# Patient Record
Sex: Female | Born: 1950 | Race: White | Hispanic: No | Marital: Single | Smoking: Never smoker
Health system: Southern US, Community
[De-identification: ages and names within clinical notes are randomized; demographics above are authoritative.]

## PROBLEM LIST (undated history)

## (undated) ENCOUNTER — Emergency Department (HOSPITAL_COMMUNITY): Payer: Medicare Other

## (undated) DIAGNOSIS — F209 Schizophrenia, unspecified: Secondary | ICD-10-CM

## (undated) DIAGNOSIS — F329 Major depressive disorder, single episode, unspecified: Secondary | ICD-10-CM

## (undated) DIAGNOSIS — F32A Depression, unspecified: Secondary | ICD-10-CM

## (undated) HISTORY — DX: Depression, unspecified: F32.A

## (undated) HISTORY — DX: Schizophrenia, unspecified: F20.9

## (undated) HISTORY — DX: Major depressive disorder, single episode, unspecified: F32.9

## (undated) HISTORY — PX: SPINE SURGERY: SHX786

---

## 1999-08-21 ENCOUNTER — Encounter: Admission: RE | Admit: 1999-08-21 | Discharge: 1999-08-21 | Payer: Self-pay | Admitting: General Practice

## 1999-08-21 ENCOUNTER — Encounter: Payer: Self-pay | Admitting: General Practice

## 1999-08-26 ENCOUNTER — Emergency Department (HOSPITAL_COMMUNITY): Admission: EM | Admit: 1999-08-26 | Discharge: 1999-08-26 | Payer: Self-pay | Admitting: Emergency Medicine

## 1999-08-26 ENCOUNTER — Encounter: Payer: Self-pay | Admitting: Emergency Medicine

## 2000-03-09 ENCOUNTER — Other Ambulatory Visit: Admission: RE | Admit: 2000-03-09 | Discharge: 2000-03-09 | Payer: Self-pay | Admitting: *Deleted

## 2000-03-17 ENCOUNTER — Ambulatory Visit (HOSPITAL_COMMUNITY): Admission: RE | Admit: 2000-03-17 | Discharge: 2000-03-17 | Payer: Self-pay

## 2001-04-07 ENCOUNTER — Ambulatory Visit (HOSPITAL_COMMUNITY): Admission: RE | Admit: 2001-04-07 | Discharge: 2001-04-07 | Payer: Self-pay | Admitting: Family Medicine

## 2001-04-12 ENCOUNTER — Encounter: Payer: Self-pay | Admitting: Family Medicine

## 2001-04-12 ENCOUNTER — Encounter: Admission: RE | Admit: 2001-04-12 | Discharge: 2001-04-12 | Payer: Self-pay | Admitting: Family Medicine

## 2001-05-12 ENCOUNTER — Encounter: Admission: RE | Admit: 2001-05-12 | Discharge: 2001-05-12 | Payer: Self-pay | Admitting: Family Medicine

## 2001-05-12 ENCOUNTER — Encounter: Payer: Self-pay | Admitting: Family Medicine

## 2001-11-10 ENCOUNTER — Encounter: Admission: RE | Admit: 2001-11-10 | Discharge: 2001-11-10 | Payer: Self-pay | Admitting: Family Medicine

## 2001-11-10 ENCOUNTER — Encounter: Payer: Self-pay | Admitting: Family Medicine

## 2002-06-14 ENCOUNTER — Encounter: Payer: Self-pay | Admitting: Family Medicine

## 2002-06-14 ENCOUNTER — Encounter: Admission: RE | Admit: 2002-06-14 | Discharge: 2002-06-14 | Payer: Self-pay | Admitting: Family Medicine

## 2003-06-15 ENCOUNTER — Encounter: Admission: RE | Admit: 2003-06-15 | Discharge: 2003-06-15 | Payer: Self-pay | Admitting: Family Medicine

## 2003-08-10 ENCOUNTER — Other Ambulatory Visit: Admission: RE | Admit: 2003-08-10 | Discharge: 2003-08-10 | Payer: Self-pay | Admitting: Family Medicine

## 2003-11-24 ENCOUNTER — Ambulatory Visit: Payer: Self-pay | Admitting: Nurse Practitioner

## 2003-12-19 ENCOUNTER — Ambulatory Visit: Payer: Self-pay | Admitting: Nurse Practitioner

## 2004-02-12 ENCOUNTER — Ambulatory Visit: Payer: Self-pay | Admitting: *Deleted

## 2004-03-20 ENCOUNTER — Ambulatory Visit: Payer: Self-pay | Admitting: Nurse Practitioner

## 2004-03-22 ENCOUNTER — Ambulatory Visit (HOSPITAL_COMMUNITY): Admission: RE | Admit: 2004-03-22 | Discharge: 2004-03-22 | Payer: Self-pay | Admitting: Internal Medicine

## 2004-04-03 ENCOUNTER — Ambulatory Visit: Payer: Self-pay | Admitting: Nurse Practitioner

## 2004-04-15 ENCOUNTER — Ambulatory Visit: Payer: Self-pay | Admitting: Nurse Practitioner

## 2004-04-23 ENCOUNTER — Ambulatory Visit (HOSPITAL_COMMUNITY): Admission: RE | Admit: 2004-04-23 | Discharge: 2004-04-23 | Payer: Self-pay | Admitting: Nurse Practitioner

## 2004-05-16 ENCOUNTER — Ambulatory Visit: Payer: Self-pay | Admitting: Nurse Practitioner

## 2004-07-24 ENCOUNTER — Ambulatory Visit: Payer: Self-pay | Admitting: Nurse Practitioner

## 2004-07-30 ENCOUNTER — Ambulatory Visit: Payer: Self-pay | Admitting: Family Medicine

## 2004-08-15 ENCOUNTER — Ambulatory Visit (HOSPITAL_COMMUNITY): Admission: RE | Admit: 2004-08-15 | Discharge: 2004-08-15 | Payer: Self-pay | Admitting: Gastroenterology

## 2004-08-15 ENCOUNTER — Encounter (INDEPENDENT_AMBULATORY_CARE_PROVIDER_SITE_OTHER): Payer: Self-pay | Admitting: Specialist

## 2004-08-22 ENCOUNTER — Ambulatory Visit: Payer: Self-pay | Admitting: Nurse Practitioner

## 2004-09-25 ENCOUNTER — Ambulatory Visit: Payer: Self-pay | Admitting: Nurse Practitioner

## 2004-11-14 ENCOUNTER — Ambulatory Visit: Payer: Self-pay | Admitting: Nurse Practitioner

## 2004-12-24 ENCOUNTER — Ambulatory Visit: Payer: Self-pay | Admitting: Nurse Practitioner

## 2005-01-24 ENCOUNTER — Ambulatory Visit: Payer: Self-pay | Admitting: Nurse Practitioner

## 2005-02-24 ENCOUNTER — Ambulatory Visit: Payer: Self-pay | Admitting: Nurse Practitioner

## 2005-03-25 ENCOUNTER — Ambulatory Visit: Payer: Self-pay | Admitting: Nurse Practitioner

## 2005-04-28 ENCOUNTER — Ambulatory Visit: Payer: Self-pay | Admitting: Nurse Practitioner

## 2005-05-30 ENCOUNTER — Ambulatory Visit: Payer: Self-pay | Admitting: Nurse Practitioner

## 2005-07-08 ENCOUNTER — Ambulatory Visit: Payer: Self-pay | Admitting: Nurse Practitioner

## 2005-07-10 ENCOUNTER — Ambulatory Visit: Payer: Self-pay | Admitting: Nurse Practitioner

## 2005-08-06 ENCOUNTER — Ambulatory Visit: Payer: Self-pay | Admitting: Nurse Practitioner

## 2006-02-12 ENCOUNTER — Ambulatory Visit: Payer: Self-pay | Admitting: Nurse Practitioner

## 2006-03-05 ENCOUNTER — Ambulatory Visit: Payer: Self-pay | Admitting: Nurse Practitioner

## 2006-03-05 ENCOUNTER — Other Ambulatory Visit: Admission: RE | Admit: 2006-03-05 | Discharge: 2006-03-05 | Payer: Self-pay | Admitting: Family Medicine

## 2006-03-05 ENCOUNTER — Encounter (INDEPENDENT_AMBULATORY_CARE_PROVIDER_SITE_OTHER): Payer: Self-pay | Admitting: Nurse Practitioner

## 2006-03-10 ENCOUNTER — Encounter: Admission: RE | Admit: 2006-03-10 | Discharge: 2006-03-10 | Payer: Self-pay | Admitting: Family Medicine

## 2006-04-13 ENCOUNTER — Ambulatory Visit: Payer: Self-pay | Admitting: Nurse Practitioner

## 2006-06-15 ENCOUNTER — Ambulatory Visit: Payer: Self-pay | Admitting: Internal Medicine

## 2006-06-19 ENCOUNTER — Encounter (INDEPENDENT_AMBULATORY_CARE_PROVIDER_SITE_OTHER): Payer: Self-pay | Admitting: Nurse Practitioner

## 2006-06-19 DIAGNOSIS — F200 Paranoid schizophrenia: Secondary | ICD-10-CM | POA: Insufficient documentation

## 2006-06-19 DIAGNOSIS — R634 Abnormal weight loss: Secondary | ICD-10-CM

## 2006-12-15 ENCOUNTER — Ambulatory Visit: Payer: Self-pay | Admitting: Family Medicine

## 2007-01-13 ENCOUNTER — Ambulatory Visit: Payer: Self-pay | Admitting: Internal Medicine

## 2007-01-13 ENCOUNTER — Encounter (INDEPENDENT_AMBULATORY_CARE_PROVIDER_SITE_OTHER): Payer: Self-pay | Admitting: Nurse Practitioner

## 2007-01-13 LAB — CONVERTED CEMR LAB
HDL: 58 mg/dL (ref >39)
LDL Cholesterol: 151 mg/dL — ABNORMAL HIGH (ref 0–99)
Triglycerides: 86 mg/dL (ref <150)
VLDL: 17 mg/dL (ref 0–40)

## 2007-03-09 ENCOUNTER — Ambulatory Visit: Payer: Self-pay | Admitting: Family Medicine

## 2007-03-09 ENCOUNTER — Encounter (INDEPENDENT_AMBULATORY_CARE_PROVIDER_SITE_OTHER): Payer: Self-pay | Admitting: Nurse Practitioner

## 2007-03-09 LAB — CONVERTED CEMR LAB
ALT: 10 units/L (ref 0–35)
Albumin: 4.3 g/dL (ref 3.5–5.2)
Basophils Absolute: 0 10*3/uL (ref 0.0–0.1)
CO2: 24 meq/L (ref 19–32)
Calcium: 9.8 mg/dL (ref 8.4–10.5)
Chloride: 104 meq/L (ref 96–112)
Eosinophils Relative: 5 % (ref 0–5)
Glucose, Bld: 103 mg/dL — ABNORMAL HIGH (ref 70–99)
HCT: 41.8 % (ref 36.0–46.0)
Hemoglobin: 13.4 g/dL (ref 12.0–15.0)
Lymphocytes Relative: 24 % (ref 12–46)
Lymphs Abs: 0.9 10*3/uL (ref 0.7–4.0)
Neutro Abs: 2.4 10*3/uL (ref 1.7–7.7)
Platelets: 278 10*3/uL (ref 150–400)
RDW: 12.8 % (ref 11.5–15.5)
Sodium: 142 meq/L (ref 135–145)
TSH: 1.485 microintl units/mL (ref 0.350–5.50)
Total Protein: 7.3 g/dL (ref 6.0–8.3)
WBC: 4 10*3/uL (ref 4.0–10.5)

## 2007-03-16 ENCOUNTER — Ambulatory Visit (HOSPITAL_COMMUNITY): Admission: RE | Admit: 2007-03-16 | Discharge: 2007-03-16 | Payer: Self-pay | Admitting: Family Medicine

## 2007-03-23 ENCOUNTER — Ambulatory Visit: Payer: Self-pay | Admitting: Family Medicine

## 2007-03-23 ENCOUNTER — Encounter (INDEPENDENT_AMBULATORY_CARE_PROVIDER_SITE_OTHER): Payer: Self-pay | Admitting: Nurse Practitioner

## 2007-03-23 LAB — CONVERTED CEMR LAB: LDL Cholesterol: 134 mg/dL — ABNORMAL HIGH (ref 0–99)

## 2008-03-22 ENCOUNTER — Encounter (INDEPENDENT_AMBULATORY_CARE_PROVIDER_SITE_OTHER): Payer: Self-pay | Admitting: Internal Medicine

## 2008-03-22 ENCOUNTER — Ambulatory Visit: Payer: Self-pay | Admitting: Family Medicine

## 2008-03-22 LAB — CONVERTED CEMR LAB
ALT: 16 units/L (ref 0–35)
AST: 26 units/L (ref 0–37)
Albumin: 4.4 g/dL (ref 3.5–5.2)
BUN: 15 mg/dL (ref 6–23)
CO2: 23 meq/L (ref 19–32)
Calcium: 9.4 mg/dL (ref 8.4–10.5)
Chloride: 107 meq/L (ref 96–112)
Eosinophils Relative: 2 % (ref 0–5)
HCT: 40.8 % (ref 36.0–46.0)
Hemoglobin: 13.4 g/dL (ref 12.0–15.0)
Lymphocytes Relative: 24 % (ref 12–46)
Lymphs Abs: 1.5 10*3/uL (ref 0.7–4.0)
MCV: 89.7 fL (ref 78.0–100.0)
Monocytes Relative: 7 % (ref 3–12)
Platelets: 256 10*3/uL (ref 150–400)
Potassium: 4.1 meq/L (ref 3.5–5.3)
RBC: 4.55 M/uL (ref 3.87–5.11)
WBC: 6.1 10*3/uL (ref 4.0–10.5)

## 2008-03-29 ENCOUNTER — Ambulatory Visit (HOSPITAL_COMMUNITY): Admission: RE | Admit: 2008-03-29 | Discharge: 2008-03-29 | Payer: Self-pay | Admitting: Internal Medicine

## 2008-04-17 ENCOUNTER — Encounter (INDEPENDENT_AMBULATORY_CARE_PROVIDER_SITE_OTHER): Payer: Self-pay | Admitting: Internal Medicine

## 2008-04-17 ENCOUNTER — Ambulatory Visit: Payer: Self-pay | Admitting: Internal Medicine

## 2008-04-17 LAB — CONVERTED CEMR LAB
Cholesterol: 235 mg/dL — ABNORMAL HIGH (ref 0–200)
HDL: 51 mg/dL (ref >39)

## 2008-07-21 ENCOUNTER — Ambulatory Visit: Payer: Self-pay | Admitting: Family Medicine

## 2008-07-28 ENCOUNTER — Ambulatory Visit (HOSPITAL_COMMUNITY): Admission: RE | Admit: 2008-07-28 | Discharge: 2008-07-28 | Payer: Self-pay | Admitting: Internal Medicine

## 2008-09-21 ENCOUNTER — Encounter (INDEPENDENT_AMBULATORY_CARE_PROVIDER_SITE_OTHER): Payer: Self-pay | Admitting: Internal Medicine

## 2008-09-21 ENCOUNTER — Ambulatory Visit: Payer: Self-pay | Admitting: Internal Medicine

## 2008-09-21 LAB — CONVERTED CEMR LAB: Vit D, 25-Hydroxy: 28 ng/mL — ABNORMAL LOW (ref 30–89)

## 2009-02-15 ENCOUNTER — Other Ambulatory Visit: Admission: RE | Admit: 2009-02-15 | Discharge: 2009-02-15 | Payer: Self-pay | Admitting: Internal Medicine

## 2009-02-15 ENCOUNTER — Ambulatory Visit: Payer: Self-pay | Admitting: Internal Medicine

## 2009-02-15 LAB — CONVERTED CEMR LAB: Pap Smear: NEGATIVE

## 2009-05-15 ENCOUNTER — Ambulatory Visit: Payer: Self-pay | Admitting: Internal Medicine

## 2009-08-15 ENCOUNTER — Ambulatory Visit: Payer: Self-pay | Admitting: Internal Medicine

## 2009-08-23 ENCOUNTER — Ambulatory Visit (HOSPITAL_COMMUNITY): Admission: RE | Admit: 2009-08-23 | Discharge: 2009-08-23 | Payer: Self-pay | Admitting: Internal Medicine

## 2009-09-27 ENCOUNTER — Encounter: Admission: RE | Admit: 2009-09-27 | Discharge: 2009-10-17 | Payer: Self-pay | Admitting: Internal Medicine

## 2009-11-27 ENCOUNTER — Encounter
Admission: RE | Admit: 2009-11-27 | Discharge: 2010-01-03 | Payer: Self-pay | Source: Home / Self Care | Attending: Family Medicine | Admitting: Family Medicine

## 2010-01-16 ENCOUNTER — Encounter
Admission: RE | Admit: 2010-01-16 | Discharge: 2010-01-16 | Payer: Self-pay | Source: Home / Self Care | Attending: Family Medicine | Admitting: Family Medicine

## 2010-01-16 ENCOUNTER — Encounter: Admit: 2010-01-16 | Payer: Self-pay | Admitting: Family Medicine

## 2010-02-13 ENCOUNTER — Other Ambulatory Visit: Payer: Self-pay | Admitting: Family Medicine

## 2010-02-13 ENCOUNTER — Other Ambulatory Visit (HOSPITAL_COMMUNITY)
Admission: RE | Admit: 2010-02-13 | Discharge: 2010-02-13 | Disposition: A | Discharge status: Home / Self Care | Payer: Medicaid Other | Source: Ambulatory Visit | Attending: Family Medicine | Admitting: Family Medicine

## 2010-02-13 ENCOUNTER — Other Ambulatory Visit (HOSPITAL_COMMUNITY): Payer: Self-pay | Admitting: Family Medicine

## 2010-02-13 DIAGNOSIS — Z01419 Encounter for gynecological examination (general) (routine) without abnormal findings: Secondary | ICD-10-CM | POA: Insufficient documentation

## 2010-02-13 DIAGNOSIS — Z1231 Encounter for screening mammogram for malignant neoplasm of breast: Secondary | ICD-10-CM

## 2010-02-21 ENCOUNTER — Encounter (HOSPITAL_COMMUNITY): Payer: Self-pay

## 2010-02-21 ENCOUNTER — Ambulatory Visit (HOSPITAL_COMMUNITY)
Admission: RE | Admit: 2010-02-21 | Discharge: 2010-02-21 | Disposition: A | Discharge status: Home / Self Care | Payer: Medicaid Other | Source: Ambulatory Visit | Attending: Family Medicine | Admitting: Family Medicine

## 2010-02-21 DIAGNOSIS — Z1231 Encounter for screening mammogram for malignant neoplasm of breast: Secondary | ICD-10-CM | POA: Insufficient documentation

## 2010-05-24 NOTE — Op Note (Signed)
[name], [name]              ACCOUNT NO.:  [id number]   MEDICAL RECORD NO.:  [id number]          PATIENT TYPE:  AMB   LOCATION:  ENDO                         FACILITY:  [facility]   PHYSICIAN:  James L. Malon Kindle., M.D.DATE OF BIRTH:  Jan 08, 1950   DATE OF PROCEDURE:  08/15/2004  DATE OF DISCHARGE:                                 OPERATIVE REPORT   PROCEDURE:  Esophagogastroduodenoscopy with biopsy.   MEDICATIONS:  Fentanyl 50 mcg, Versed 5 mg IV.   INDICATIONS FOR PROCEDURE:  Severe weight loss of unclear cause, vague  dyspepsia.   DESCRIPTION OF PROCEDURE:  The procedure had been explained to the patient  and consent obtained. With the patient in the left lateral decubitus  position, the Olympus scope was inserted and advanced __________ pylorus  identified and passed. The duodenum including the bulb and second portion  were seen well. I advanced the scope down to the full length which was felt  to be in the distal third duodenum and possibly even the fourth duodenum.  Multiple biopsies were obtained to look for celiac disease. The scope was  withdrawn. No other abnormalities were seen in the duodenum. The pyloric  channel and external body were normal. Fundus and cardia seen well on the  retroflexed view and were normal. The patient had a hiatal hernia with a  widely patent GE junction with a red distal esophagus consistent with  esophageal reflux, no ulceration or inflammation. The proximal esophagus was  seen well and was endoscopically normal. The scope was withdrawn, the  patient tolerated the procedure well.   ASSESSMENT:  1.  Gastroesophageal reflux disease possibly causing some of her upper      symptoms, 530.81.  2.  Weight loss, need to rule out celiac disease.   PLAN:  Will check path results, start empirically on Prilosec generically  and do colonoscopy at this time.       JLE/MEDQ  D:  08/15/2004  T:  08/15/2004  Job:  [id number]   cc:   Tresa Endo L. Philipp Deputy,  M.D.  [phone] [address]  [place] [address]  Fax: [phone]

## 2010-05-24 NOTE — Op Note (Signed)
Emily, Vega              ACCOUNT NO.:  [id number]   MEDICAL RECORD NO.:  [id number]          PATIENT TYPE:  AMB   LOCATION:  ENDO                         FACILITY:  [facility]   PHYSICIAN:  James L. Malon Kindle., M.D.DATE OF BIRTH:  [birth year]   DATE OF PROCEDURE:  08/15/2004  DATE OF DISCHARGE:                                 OPERATIVE REPORT   PROCEDURE:  Colonoscopy.   MEDICATIONS:  The patient received fentanyl 50 mcg and Versed 5 mg for both  procedures.   SCOPE:  Pediatric adjustable scope.   INDICATIONS FOR PROCEDURE:  Weight loss with no previous colon screening.   DESCRIPTION OF PROCEDURE:  Following the endoscopy, the patient was turned  around, the pediatric adjustable colonoscope was inserted and advanced. The  prep was excellent. She had an extraordinarily long tortuous colon,  maneuvers including abdominal pressure were required. We were able to reach  the cecum. The ileocecal valve and appendiceal orifice identified, scope  withdrawn. Mucosa examined on withdrawal, normal mucosal pattern throughout  without signs of colitis. No polyps, masses or diverticulosis seen. Again a  very long tortuous colon. Rectum free of lesions. Colon decompressed as much  as possible and scope withdrawn. The patient tolerated the procedure well.   ASSESSMENT:  Normal screening colonoscopy, V76.51.   PLAN:  Will followup in the office in six months and encourage her to eat  whatever she wants.       JLE/MEDQ  D:  08/15/2004  T:  08/15/2004  Job:  [id number]   cc:   Tresa Endo L. Philipp Deputy, M.D.  [phone] [address]  [place] [address]  Fax: [phone]

## 2011-03-14 ENCOUNTER — Other Ambulatory Visit (HOSPITAL_COMMUNITY): Payer: Self-pay | Admitting: Family Medicine

## 2011-03-14 DIAGNOSIS — Z1231 Encounter for screening mammogram for malignant neoplasm of breast: Secondary | ICD-10-CM

## 2011-03-28 ENCOUNTER — Ambulatory Visit (HOSPITAL_COMMUNITY)
Admission: RE | Admit: 2011-03-28 | Discharge: 2011-03-28 | Disposition: A | Discharge status: Home / Self Care | Payer: Medicaid Other | Source: Ambulatory Visit | Attending: Family Medicine | Admitting: Family Medicine

## 2011-03-28 DIAGNOSIS — Z1231 Encounter for screening mammogram for malignant neoplasm of breast: Secondary | ICD-10-CM

## 2011-06-20 ENCOUNTER — Emergency Department (HOSPITAL_COMMUNITY)
Admission: EM | Admit: 2011-06-20 | Discharge: 2011-06-20 | Disposition: A | Discharge status: Home / Self Care | Payer: Medicaid Other | Source: Home / Self Care | Attending: Family Medicine | Admitting: Family Medicine

## 2011-06-20 ENCOUNTER — Encounter (HOSPITAL_COMMUNITY): Payer: Self-pay | Admitting: Emergency Medicine

## 2011-06-20 DIAGNOSIS — L2081 Atopic neurodermatitis: Secondary | ICD-10-CM

## 2011-06-20 DIAGNOSIS — L2089 Other atopic dermatitis: Secondary | ICD-10-CM

## 2011-06-20 MED ORDER — FLUTICASONE PROPIONATE 0.05 % EX CREA: TOPICAL_CREAM | Freq: Two times a day (BID) | CUTANEOUS | Status: AC

## 2011-06-20 NOTE — ED Notes (Signed)
Pt here with sunburn to right forearm and left hand x 2 weeks after being exposed to sun without sunscreen.scabbed red areas seen but no inflammation

## 2011-06-20 NOTE — ED Provider Notes (Signed)
History     CSN: [id number]  Arrival date & time 06/20/11  1101   First MD Initiated Contact with Patient 06/20/11 1112      Chief Complaint  Patient presents with  . Sunburn    (Consider location/radiation/quality/duration/timing/severity/associated sxs/prior treatment) Patient is a 61 y.o. female presenting with rash.  Rash  This is a new problem. The current episode started more than 1 week ago. The problem has not changed since onset.Associated with: feels related to sun exposure, typical rxn. There has been no fever. The rash is present on the right hand and left hand. The patient is experiencing no pain. Associated symptoms include blisters and itching.    History reviewed. No pertinent past medical history.  Past Surgical History  Procedure Date  . Spine surgery     No family history on file.  History  Substance Use Topics  . Smoking status: Never Smoker   . Smokeless tobacco: Not on file  . Alcohol Use: No    OB History    Grav Para Term Preterm Abortions TAB SAB Ect Mult Living                  Review of Systems  Constitutional: Negative.   Skin: Positive for itching and rash.    Allergies  Review of patient's allergies indicates no known allergies.  Home Medications   Current Outpatient Rx  Name Route Sig Dispense Refill  . VITAMIN D 1000 UNITS PO TABS Oral Take 1,000 Units by mouth daily.    Marland Kitchen FLUOXETINE HCL 20 MG PO CAPS Oral Take 20 mg by mouth daily.    Marland Kitchen OLANZAPINE 10 MG PO TABS Oral Take 10 mg by mouth at bedtime.    Marland Kitchen PENICILLIN V POTASSIUM 250 MG PO TABS Oral Take 250 mg by mouth 4 (four) times daily. Pt been on med for 10dys for nose surgery    . FLUTICASONE PROPIONATE 0.05 % EX CREA Topical Apply topically 2 (two) times daily. 30 g 0    BP 110/73  Pulse 88  Temp 97.9 F (36.6 C) (Oral)  Resp 16  SpO2 96%  Physical Exam  Nursing note and vitals reviewed. Constitutional: She appears well-developed and well-nourished.  Skin: Skin  is warm and dry. Rash noted.       Patchy excoriated dry rash on dorsum of hands and wrists.    ED Course  Procedures (including critical care time)  Labs Reviewed - No data to display No results found.   1. Atopic neurodermatitis       MDM          Linna Hoff, MD 06/20/11 1131

## 2012-05-18 ENCOUNTER — Other Ambulatory Visit (HOSPITAL_COMMUNITY): Payer: Self-pay | Admitting: Internal Medicine

## 2012-05-18 DIAGNOSIS — Z1231 Encounter for screening mammogram for malignant neoplasm of breast: Secondary | ICD-10-CM

## 2012-05-27 ENCOUNTER — Ambulatory Visit (HOSPITAL_COMMUNITY)
Admission: RE | Admit: 2012-05-27 | Discharge: 2012-05-27 | Disposition: A | Discharge status: Home / Self Care | Payer: Medicaid Other | Source: Ambulatory Visit | Attending: Internal Medicine | Admitting: Internal Medicine

## 2012-05-27 DIAGNOSIS — Z1231 Encounter for screening mammogram for malignant neoplasm of breast: Secondary | ICD-10-CM | POA: Insufficient documentation

## 2012-07-08 ENCOUNTER — Ambulatory Visit: Payer: Self-pay | Admitting: Obstetrics

## 2012-08-26 ENCOUNTER — Ambulatory Visit (INDEPENDENT_AMBULATORY_CARE_PROVIDER_SITE_OTHER): Payer: Medicaid Other | Admitting: Obstetrics

## 2012-08-26 ENCOUNTER — Encounter: Payer: Self-pay | Admitting: Obstetrics

## 2012-08-26 VITALS — BP 107/68 | HR 88 | Temp 98.8°F | Ht 60.0 in | Wt 105.6 lb

## 2012-08-26 DIAGNOSIS — A499 Bacterial infection, unspecified: Secondary | ICD-10-CM

## 2012-08-26 DIAGNOSIS — Z113 Encounter for screening for infections with a predominantly sexual mode of transmission: Secondary | ICD-10-CM

## 2012-08-26 DIAGNOSIS — N760 Acute vaginitis: Secondary | ICD-10-CM

## 2012-08-26 DIAGNOSIS — B9689 Other specified bacterial agents as the cause of diseases classified elsewhere: Secondary | ICD-10-CM

## 2012-08-26 DIAGNOSIS — Z0000 Encounter for general adult medical examination without abnormal findings: Secondary | ICD-10-CM

## 2012-08-26 NOTE — Progress Notes (Signed)
.   Subjective:     [name] is a 62 y.o. female here for a routine exam.  No current complaints.  Personal health questionnaire reviewed: yes.   Gynecologic History No LMP recorded. Patient is postmenopausal. Contraception: none Last Pap: 2008. Results were: normal Last mammogram: 2014. Results were: normal  Obstetric History OB History  No data available     The following portions of the patient's history were reviewed and updated as appropriate: allergies, current medications, past family history, past medical history, past social history, past surgical history and problem list.  Review of Systems Pertinent items are noted in HPI.    Objective:    General appearance: alert and no distress Breasts: normal appearance, no masses or tenderness Abdomen: normal findings: soft, non-tender Pelvic: cervix normal in appearance, external genitalia normal, no adnexal masses or tenderness, no cervical motion tenderness, uterus normal size, shape, and consistency and vagina normal without discharge Extremities: extremities normal, atraumatic, no cyanosis or edema    Assessment:    Healthy female exam.    Plan:    Follow up in: 1 year.

## 2012-08-27 ENCOUNTER — Encounter: Payer: Self-pay | Admitting: Obstetrics

## 2012-08-27 DIAGNOSIS — B9689 Other specified bacterial agents as the cause of diseases classified elsewhere: Secondary | ICD-10-CM | POA: Insufficient documentation

## 2012-08-27 LAB — PAP IG W/ RFLX HPV ASCU

## 2013-05-17 ENCOUNTER — Other Ambulatory Visit: Payer: Self-pay | Admitting: Internal Medicine

## 2013-05-17 DIAGNOSIS — E2839 Other primary ovarian failure: Secondary | ICD-10-CM

## 2013-08-18 ENCOUNTER — Other Ambulatory Visit (HOSPITAL_COMMUNITY): Payer: Self-pay | Admitting: Internal Medicine

## 2013-08-18 DIAGNOSIS — Z1231 Encounter for screening mammogram for malignant neoplasm of breast: Secondary | ICD-10-CM

## 2013-08-29 ENCOUNTER — Ambulatory Visit: Payer: Medicaid Other | Admitting: Obstetrics

## 2013-08-29 ENCOUNTER — Ambulatory Visit (HOSPITAL_COMMUNITY)
Admission: RE | Admit: 2013-08-29 | Discharge: 2013-08-29 | Disposition: A | Discharge status: Home / Self Care | Payer: Medicaid Other | Source: Ambulatory Visit

## 2013-08-29 ENCOUNTER — Ambulatory Visit (HOSPITAL_COMMUNITY)
Admission: RE | Admit: 2013-08-29 | Discharge: 2013-08-29 | Disposition: A | Discharge status: Home / Self Care | Payer: Medicaid Other | Source: Ambulatory Visit | Attending: Internal Medicine | Admitting: Internal Medicine

## 2013-08-29 ENCOUNTER — Ambulatory Visit (HOSPITAL_COMMUNITY): Payer: Medicaid Other

## 2013-08-29 DIAGNOSIS — E2839 Other primary ovarian failure: Secondary | ICD-10-CM

## 2013-08-29 DIAGNOSIS — Z780 Asymptomatic menopausal state: Secondary | ICD-10-CM | POA: Diagnosis not present

## 2013-08-29 DIAGNOSIS — Z13820 Encounter for screening for osteoporosis: Secondary | ICD-10-CM | POA: Insufficient documentation

## 2013-08-29 DIAGNOSIS — Z1231 Encounter for screening mammogram for malignant neoplasm of breast: Secondary | ICD-10-CM

## 2014-06-27 ENCOUNTER — Emergency Department (HOSPITAL_COMMUNITY)
Admission: EM | Admit: 2014-06-27 | Discharge: 2014-06-28 | Disposition: A | Discharge status: Home / Self Care | Payer: Medicaid Other | Attending: Emergency Medicine | Admitting: Emergency Medicine

## 2014-06-27 ENCOUNTER — Emergency Department (HOSPITAL_COMMUNITY): Payer: Medicaid Other

## 2014-06-27 ENCOUNTER — Encounter (HOSPITAL_COMMUNITY): Payer: Self-pay | Admitting: Emergency Medicine

## 2014-06-27 DIAGNOSIS — F329 Major depressive disorder, single episode, unspecified: Secondary | ICD-10-CM | POA: Diagnosis not present

## 2014-06-27 DIAGNOSIS — Z79899 Other long term (current) drug therapy: Secondary | ICD-10-CM | POA: Diagnosis not present

## 2014-06-27 DIAGNOSIS — R0989 Other specified symptoms and signs involving the circulatory and respiratory systems: Secondary | ICD-10-CM | POA: Diagnosis present

## 2014-06-27 DIAGNOSIS — F419 Anxiety disorder, unspecified: Secondary | ICD-10-CM | POA: Diagnosis not present

## 2014-06-27 DIAGNOSIS — F209 Schizophrenia, unspecified: Secondary | ICD-10-CM | POA: Diagnosis not present

## 2014-06-27 DIAGNOSIS — F458 Other somatoform disorders: Secondary | ICD-10-CM | POA: Diagnosis not present

## 2014-06-27 LAB — CBC WITH DIFFERENTIAL/PLATELET
BASOS PCT: 0 % (ref 0–1)
Basophils Absolute: 0 10*3/uL (ref 0.0–0.1)
EOS ABS: 0.1 10*3/uL (ref 0.0–0.7)
Eosinophils Relative: 2 % (ref 0–5)
HCT: 37 % (ref 36.0–46.0)
HEMOGLOBIN: 12.1 g/dL (ref 12.0–15.0)
LYMPHS ABS: 1.4 10*3/uL (ref 0.7–4.0)
LYMPHS PCT: 22 % (ref 12–46)
MCH: 29.6 pg (ref 26.0–34.0)
MCHC: 32.7 g/dL (ref 30.0–36.0)
MCV: 90.5 fL (ref 78.0–100.0)
Monocytes Absolute: 0.7 10*3/uL (ref 0.1–1.0)
Monocytes Relative: 11 % (ref 3–12)
Neutro Abs: 4.2 10*3/uL (ref 1.7–7.7)
Neutrophils Relative %: 65 % (ref 43–77)
Platelets: 248 10*3/uL (ref 150–400)
RBC: 4.09 MIL/uL (ref 3.87–5.11)
RDW: 13.3 % (ref 11.5–15.5)
WBC: 6.5 10*3/uL (ref 4.0–10.5)

## 2014-06-27 LAB — BASIC METABOLIC PANEL
ANION GAP: 9 (ref 5–15)
BUN: 14 mg/dL (ref 6–20)
CO2: 26 mmol/L (ref 22–32)
Calcium: 8.8 mg/dL — ABNORMAL LOW (ref 8.9–10.3)
Chloride: 107 mmol/L (ref 101–111)
Creatinine, Ser: 0.61 mg/dL (ref 0.44–1.00)
GFR calc non Af Amer: >60 mL/min (ref >60)
Glucose, Bld: 110 mg/dL — ABNORMAL HIGH (ref 65–99)
POTASSIUM: 3 mmol/L — AB (ref 3.5–5.1)
Sodium: 142 mmol/L (ref 135–145)

## 2014-06-27 MED ORDER — DIPHENHYDRAMINE HCL 50 MG/ML IJ SOLN
12.5000 mg | Freq: Once | INTRAMUSCULAR | Status: AC
  Administered 2014-06-27: 12.5 mg via INTRAVENOUS
  Filled 2014-06-27: qty 1

## 2014-06-27 MED ORDER — METOCLOPRAMIDE HCL 5 MG/ML IJ SOLN
10.0000 mg | Freq: Once | INTRAMUSCULAR | Status: AC
  Administered 2014-06-27: 10 mg via INTRAVENOUS
  Filled 2014-06-27: qty 2

## 2014-06-27 MED ORDER — GLUCAGON HCL RDNA (DIAGNOSTIC) 1 MG IJ SOLR
INTRAMUSCULAR | Status: AC
  Administered 2014-06-27: 0.5 mg via INTRAVENOUS
  Filled 2014-06-27: qty 1

## 2014-06-27 MED ORDER — GLUCAGON HCL RDNA (DIAGNOSTIC) 1 MG IJ SOLR
0.5000 mg | Freq: Once | INTRAMUSCULAR | Status: AC
  Administered 2014-06-27: 0.5 mg via INTRAVENOUS

## 2014-06-27 MED ORDER — GI COCKTAIL ~~LOC~~
30.0000 mL | Freq: Once | ORAL | Status: AC
  Administered 2014-06-27: 30 mL via ORAL
  Filled 2014-06-27: qty 30

## 2014-06-27 NOTE — ED Notes (Signed)
Patient given coke to drink

## 2014-06-27 NOTE — ED Notes (Signed)
Patient reports swallowing part of an apple and feeling like some of it is still stuck in her throat.  Reports this happened tonight after trying to eat the apple without her dentures in.

## 2014-06-27 NOTE — ED Notes (Signed)
Called patients ride "[place]".  Vernita states she will come to ED to pick up pt.

## 2014-06-27 NOTE — ED Notes (Signed)
After discharge patient in lobby talking to ride on phone.  Patient then back up to registration desk stating she felt dizzy.  Placed in wheelchair BP114/67; HR84; R16; SpO2 100%RA.  Patient placed in lobby in wheelchair.  States she feels better after vital signs taken.

## 2014-06-27 NOTE — Discharge Instructions (Signed)
Eat soft diet for several days.   Make sure that you wear dentures when you eat.   Follow up with your doctor.   Return to ER if you are unable to swallow, cough, vomiting.

## 2014-06-27 NOTE — ED Notes (Signed)
Bed: WA04 Expected date:  Expected time:  Means of arrival:  Comments: EMS female/choked on an apple

## 2014-06-27 NOTE — ED Provider Notes (Signed)
CSN: [id number]     Arrival date & time 06/27/14  2109 History   First MD Initiated Contact with Patient 06/27/14 2113     Chief Complaint  Patient presents with  . Foreign Body     (Consider location/radiation/quality/duration/timing/severity/associated sxs/prior Treatment) The history is provided by the patient.  TALESHIA Vega is a 64 y.o. female hx of depression, schizophrenia here with possible food impaction. Patient ate an apple around 8 PM and didn't wear her dentures and felt the apple stuck in her throat. She was able to swallow water afterwards but still has globus sensation. Denies any trouble breathing denies any chest pain. Denies any previous history of food impaction or esophageal strictures.   Past Medical History  Diagnosis Date  . Depression   . Schizophrenia    Past Surgical History  Procedure Laterality Date  . Spine surgery     Family History  Problem Relation Age of Onset  . Heart disease Mother    History  Substance Use Topics  . Smoking status: Never Smoker   . Smokeless tobacco: Not on file  . Alcohol Use: No   OB History    No data available     Review of Systems  HENT:       Globus sensation   All other systems reviewed and are negative.     Allergies  Review of patient's allergies indicates no known allergies.  Home Medications   Prior to Admission medications   Medication Sig Start Date End Date Taking? Authorizing Provider  alendronate (FOSAMAX) 70 MG tablet Take 70 mg by mouth once a week. Take with a full glass of water on an empty stomach.   Yes Historical Provider, MD  cholecalciferol (VITAMIN D) 1000 UNITS tablet Take 1,000 Units by mouth daily.   Yes Historical Provider, MD  FLUoxetine (PROZAC) 20 MG capsule Take 20 mg by mouth daily.   Yes Historical Provider, MD  OLANZapine (ZYPREXA) 10 MG tablet Take 10 mg by mouth at bedtime.   Yes Historical Provider, MD  omeprazole (PRILOSEC) 20 MG capsule Take 20 mg by mouth daily.    Yes Historical Provider, MD  pravastatin (PRAVACHOL) 10 MG tablet Take 10 mg by mouth daily.   Yes Historical Provider, MD   BP 138/67 mmHg  Pulse 72  Temp(Src) 97.9 F (36.6 C) (Oral)  Resp 16  SpO2 98% Physical Exam  Constitutional: She is oriented to person, place, and time. She appears well-developed and well-nourished.  Slightly anxious   HENT:  Head: Normocephalic.  Mouth/Throat: Oropharynx is clear and moist.  No foreign body visualized   Eyes: Conjunctivae are normal. Pupils are equal, round, and reactive to light.  Neck:  No stridor.   Cardiovascular: Normal rate, regular rhythm and normal heart sounds.   Pulmonary/Chest: Effort normal and breath sounds normal. No respiratory distress. She has no wheezes. She has no rales.  Abdominal: Soft. Bowel sounds are normal. She exhibits no distension. There is no tenderness. There is no rebound.  Musculoskeletal: Normal range of motion. She exhibits no edema or tenderness.  Neurological: She is alert and oriented to person, place, and time. No cranial nerve deficit. Coordination normal.  Skin: Skin is warm and dry.  Psychiatric: She has a normal mood and affect. Her behavior is normal. Judgment and thought content normal.  Nursing note and vitals reviewed.   ED Course  Procedures (including critical care time) Labs Review Labs Reviewed  CBC WITH DIFFERENTIAL/PLATELET  BASIC METABOLIC PANEL  Imaging Review Dg Neck Soft Tissue  06/27/2014   CLINICAL DATA:  Piece of apple stuck in throat, with mild throat pain. Initial encounter.  EXAM: NECK SOFT TISSUES - 1+ VIEW  COMPARISON:  Cervical spine radiographs performed 08/23/2009  FINDINGS: No radiopaque foreign bodies are seen. Note that a piece of apple is unlikely to be visible on radiograph. Somewhat unusual thickening of the prevertebral soft tissues more inferiorly appears to be stable from 2011 and likely reflects the patient's baseline.  The nasopharynx, oropharynx and  hypopharynx are grossly unremarkable. The epiglottis is normal in thickness. The proximal trachea is within normal limits. There is near complete absence of the dentition.  Degenerative change is noted at the mid cervical spine, with prominent anterior osteophytes and mild kyphosis. The visualized lung apices are grossly clear.  IMPRESSION: No radiopaque foreign body seen. Note that a piece of apple is unlikely to be visible on radiograph.   Electronically Signed   By: Roanna Raider M.D.   On: 06/27/2014 21:44     EKG Interpretation None      MDM   Final diagnoses:  None   [name] is a 64 y.o. female here with food impaction, globus sensation. Will get xrays and give glucagon and reglan. If she can't keep fluids down will need endoscopy.   10:27 PM Xray unremarkable. Felt better now. Tolerated fluids and now feels better. Encourage soft diet and use dentures when eating.   Richardean Canal, MD 06/27/14 2229

## 2014-06-27 NOTE — ED Notes (Signed)
Patient's ride called back and stated she would be unable to pick up patient.  [facility] called for transport back to facility.

## 2014-08-10 ENCOUNTER — Other Ambulatory Visit (HOSPITAL_COMMUNITY): Payer: Self-pay | Admitting: Internal Medicine

## 2014-08-10 DIAGNOSIS — Z1231 Encounter for screening mammogram for malignant neoplasm of breast: Secondary | ICD-10-CM

## 2014-09-01 ENCOUNTER — Ambulatory Visit (HOSPITAL_COMMUNITY): Payer: Medicaid Other

## 2014-09-05 ENCOUNTER — Ambulatory Visit (HOSPITAL_COMMUNITY): Payer: Medicaid Other

## 2014-09-07 ENCOUNTER — Ambulatory Visit (HOSPITAL_COMMUNITY)
Admission: RE | Admit: 2014-09-07 | Discharge: 2014-09-07 | Disposition: A | Discharge status: Home / Self Care | Payer: Medicaid Other | Source: Ambulatory Visit | Attending: Internal Medicine | Admitting: Internal Medicine

## 2014-09-07 DIAGNOSIS — Z1231 Encounter for screening mammogram for malignant neoplasm of breast: Secondary | ICD-10-CM | POA: Insufficient documentation

## 2016-05-20 ENCOUNTER — Other Ambulatory Visit: Payer: Self-pay | Admitting: Internal Medicine

## 2016-05-20 DIAGNOSIS — Z1231 Encounter for screening mammogram for malignant neoplasm of breast: Secondary | ICD-10-CM

## 2016-06-05 ENCOUNTER — Ambulatory Visit: Payer: Medicaid Other

## 2016-08-22 ENCOUNTER — Ambulatory Visit
Admission: RE | Admit: 2016-08-22 | Discharge: 2016-08-22 | Disposition: A | Discharge status: Home / Self Care | Payer: Medicaid Other | Source: Ambulatory Visit | Attending: Internal Medicine | Admitting: Internal Medicine

## 2016-08-22 DIAGNOSIS — Z1231 Encounter for screening mammogram for malignant neoplasm of breast: Secondary | ICD-10-CM

## 2018-03-19 ENCOUNTER — Other Ambulatory Visit: Payer: Self-pay | Admitting: Internal Medicine

## 2018-03-19 DIAGNOSIS — E2839 Other primary ovarian failure: Secondary | ICD-10-CM

## 2018-03-19 DIAGNOSIS — Z1231 Encounter for screening mammogram for malignant neoplasm of breast: Secondary | ICD-10-CM

## 2020-03-26 ENCOUNTER — Encounter (HOSPITAL_COMMUNITY): Payer: Self-pay

## 2020-03-26 ENCOUNTER — Other Ambulatory Visit: Payer: Self-pay

## 2020-03-26 ENCOUNTER — Emergency Department (HOSPITAL_COMMUNITY)
Admission: EM | Admit: 2020-03-26 | Discharge: 2020-03-29 | Disposition: A | Discharge status: Home / Self Care | Payer: Medicare Other | Attending: Emergency Medicine | Admitting: Emergency Medicine

## 2020-03-26 DIAGNOSIS — F209 Schizophrenia, unspecified: Secondary | ICD-10-CM | POA: Diagnosis not present

## 2020-03-26 DIAGNOSIS — U071 COVID-19: Secondary | ICD-10-CM | POA: Insufficient documentation

## 2020-03-26 DIAGNOSIS — F200 Paranoid schizophrenia: Secondary | ICD-10-CM | POA: Diagnosis not present

## 2020-03-26 DIAGNOSIS — Z79899 Other long term (current) drug therapy: Secondary | ICD-10-CM | POA: Diagnosis not present

## 2020-03-26 DIAGNOSIS — Z046 Encounter for general psychiatric examination, requested by authority: Secondary | ICD-10-CM | POA: Diagnosis present

## 2020-03-26 DIAGNOSIS — Z659 Problem related to unspecified psychosocial circumstances: Secondary | ICD-10-CM | POA: Diagnosis not present

## 2020-03-26 LAB — RAPID URINE DRUG SCREEN, HOSP PERFORMED
Amphetamines: NOT DETECTED
Barbiturates: NOT DETECTED
Benzodiazepines: NOT DETECTED
Cocaine: NOT DETECTED
Opiates: NOT DETECTED
Tetrahydrocannabinol: NOT DETECTED

## 2020-03-26 LAB — ETHANOL: Alcohol, Ethyl (B): <10 mg/dL (ref <10)

## 2020-03-26 LAB — CBC WITH DIFFERENTIAL/PLATELET
Abs Immature Granulocytes: 0.03 10*3/uL (ref 0.00–0.07)
Basophils Absolute: 0 10*3/uL (ref 0.0–0.1)
Basophils Relative: 1 %
Eosinophils Absolute: 0 10*3/uL (ref 0.0–0.5)
Eosinophils Relative: 0 %
HCT: 44.9 % (ref 36.0–46.0)
Hemoglobin: 14.4 g/dL (ref 12.0–15.0)
Immature Granulocytes: 1 %
Lymphocytes Relative: 12 %
Lymphs Abs: 0.6 10*3/uL — ABNORMAL LOW (ref 0.7–4.0)
MCH: 30.6 pg (ref 26.0–34.0)
MCHC: 32.1 g/dL (ref 30.0–36.0)
MCV: 95.5 fL (ref 80.0–100.0)
Monocytes Absolute: 0.4 10*3/uL (ref 0.1–1.0)
Monocytes Relative: 7 %
Neutro Abs: 4.4 10*3/uL (ref 1.7–7.7)
Neutrophils Relative %: 79 %
Platelets: 282 10*3/uL (ref 150–400)
RBC: 4.7 MIL/uL (ref 3.87–5.11)
RDW: 14.1 % (ref 11.5–15.5)
WBC: 5.5 10*3/uL (ref 4.0–10.5)
nRBC: 0 % (ref 0.0–0.2)

## 2020-03-26 LAB — COMPREHENSIVE METABOLIC PANEL
ALT: 15 U/L (ref 0–44)
AST: 22 U/L (ref 15–41)
Albumin: 4.4 g/dL (ref 3.5–5.0)
Alkaline Phosphatase: 88 U/L (ref 38–126)
Anion gap: 10 (ref 5–15)
BUN: 17 mg/dL (ref 8–23)
CO2: 28 mmol/L (ref 22–32)
Calcium: 9.4 mg/dL (ref 8.9–10.3)
Chloride: 101 mmol/L (ref 98–111)
Creatinine, Ser: 0.66 mg/dL (ref 0.44–1.00)
GFR, Estimated: >60 mL/min (ref >60)
Glucose, Bld: 116 mg/dL — ABNORMAL HIGH (ref 70–99)
Potassium: 3.7 mmol/L (ref 3.5–5.1)
Sodium: 139 mmol/L (ref 135–145)
Total Bilirubin: 0.8 mg/dL (ref 0.3–1.2)
Total Protein: 8.1 g/dL (ref 6.5–8.1)
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
## 2020-03-26 NOTE — ED Notes (Signed)
Pt belongings moved to 1-8 Nursing station patient cabinet. Pt has 2 belongings bag.

## 2020-03-26 NOTE — ED Notes (Signed)
Pt stated that she wanted to speak to a doctor before changing into scrubs or allowing blood work.

## 2020-03-27 LAB — RESP PANEL BY RT-PCR (FLU A&B, COVID) ARPGX2
Influenza A by PCR: NEGATIVE
Influenza B by PCR: NEGATIVE
SARS Coronavirus 2 by RT PCR: POSITIVE — AB

## 2020-03-27 MED ORDER — FLUOXETINE HCL 20 MG PO CAPS
20.0000 mg | ORAL_CAPSULE | Freq: Every day | ORAL | Status: DC
  Administered 2020-03-27 – 2020-03-29 (×3): 20 mg via ORAL
  Filled 2020-03-27 (×2): qty 1

## 2020-03-27 MED ORDER — OLANZAPINE 5 MG PO TABS
7.5000 mg | ORAL_TABLET | Freq: Every day | ORAL | Status: DC
  Administered 2020-03-27 – 2020-03-28 (×2): 7.5 mg via ORAL
  Filled 2020-03-27 (×2): qty 1

## 2020-03-27 NOTE — Progress Notes (Signed)
TOC CM CSW spoke with [name]/[facility] in regards to pt [phone] and/or [phone].  Pt has a sister/Barbara Stokes [phone], and a aunt/Jundy Norris [phone].  Also Denise/apartment manager [phone].  Ricquita Tarpley-Carter, MSW, LCSW-A Pronouns:  She, Her, Hers                  [facility] ED Transitions of CareClinical Social Worker [email or web address] [phone]

## 2020-03-27 NOTE — ED Notes (Signed)
Pt resting in stretcher, NAD. Pending placement at this time.

## 2020-03-27 NOTE — BH Assessment (Signed)
Comprehensive Clinical Assessment (CCA) Note  03/27/2020 [name] [id number]   Disposition Nira Conn, NP, patient meets inpatient criteria. Geropsychiatry recommended. Disposition SW to secure placement.   The patient demonstrates the following risk factors for suicide: Chronic risk factors for suicide include: psychiatric disorder of schizophrenia. Acute risk factors for suicide include: N/A. Protective factors for this patient include: hope for the future. Considering these factors, the overall suicide risk at this point appears to be moderate. Patient is appropriate for outpatient follow up.   Flowsheet Row ED from 03/26/2020 in [facility]  C-SSRS RISK CATEGORY Error: Question 2 not populated     Recommends 1:1 monitoring.  [name] is a 70 year old female presenting under IVC due to mania and anxiety. When asked why are you here, patient stated "I was walking from [place] to my apartment when my cottage cheese spoiled, then the police picked me up". 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 Patient did report medication management through [facility], taking Zyprexa and Prozac. When asking questions, patient spoke about upbringings with parents and every location she lived. Questions were answered with irrelevant answers. Patient denied SI, HI, psychosis and alcohol/drug usage. Patient was polite and cooperative.   PER IVC  petition states that the patient left her off and on today and left the house.  She melted a plastic bottle inside and was noted to be living in squalid living conditions.  She is not taking care of herself.  There was feces smeared on the walls.  Chief Complaint:  Chief Complaint  Patient presents with  . IVC   Visit Diagnosis: History of schizophrenia  CCA Screening, Triage and Referral (STR)  Patient Reported Information How did you hear about [place]? Legal System  Referral  name: No data recorded Referral phone number: No data recorded  Whom do you see for routine medical problems? -- Rich Reining)  Practice/Facility Name: No data recorded Practice/Facility Phone Number: No data recorded Name of Contact: No data recorded Contact Number: No data recorded Contact Fax Number: No data recorded Prescriber Name: No data recorded Prescriber Address (if known): No data recorded  What Is the Reason for Your Visit/Call Today? No data recorded How Long Has This Been Causing You Problems? -- Rich Reining)  What Do You Feel Would Help You the Most Today? No data recorded  Have You Recently Been in Any Inpatient Treatment (Hospital/Detox/Crisis Center/28-Day Program)? No  Name/Location of Program/Hospital:No data recorded How Long Were You There? No data recorded When Were You Discharged? No data recorded  Have You Ever Received Services From [facility] Before? No  Who Do You See at [facility]? No data recorded  Have You Recently Had Any Thoughts About Hurting Yourself? No  Are You Planning to Commit Suicide/Harm Yourself At This time? No  Have you Recently Had Thoughts About Hurting Someone Karolee Ohs? No  Explanation: No data recorded  Have You Used Any Alcohol or Drugs in the Past 24 Hours? No  How Long Ago Did You Use Drugs or Alcohol? No data recorded What Did You Use and How Much? No data recorded  Do You Currently Have a Therapist/Psychiatrist? No  Name of Therapist/Psychiatrist: No data recorded  Have You Been Recently Discharged From Any Office Practice or Programs? No  Explanation of Discharge From Practice/Program: No data recorded  CCA Screening Triage Referral Assessment Type of Contact: Tele-Assessment  Is this Initial or Reassessment? Initial Assessment  Date Telepsych consult ordered  in [facility]:  03/26/2020  Time Telepsych consult ordered in [facility]:  [id number]  Patient Reported Information Reviewed? Yes  Patient Left Without Being Seen? No data  recorded Reason for Not Completing Assessment: No data recorded  Collateral Involvement: none reported  Does Patient Have a Court Appointed Legal Guardian? No data recorded Name and Contact of Legal Guardian: No data recorded If Minor and Not Living with Parent(s), Who has Custody? No data recorded Is CPS involved or ever been involved? Never  Is APS involved or ever been involved? Never  Patient Determined To Be At Risk for Harm To Self or Others Based on Review of Patient Reported Information or Presenting Complaint? No  Method: No data recorded Availability of Means: No data recorded Intent: No data recorded Notification Required: No data recorded Additional Information for Danger to Others Potential: No data recorded Additional Comments for Danger to Others Potential: No data recorded Are There Guns or Other Weapons in Your Home? No data recorded Types of Guns/Weapons: No data recorded Are These Weapons Safely Secured?                            No data recorded Who Could Verify You Are Able To Have These Secured: No data recorded Do You Have any Outstanding Charges, Pending Court Dates, Parole/Probation? No data recorded Contacted To Inform of Risk of Harm To Self or Others: No data recorded  Location of Assessment: [facility] ED  Does Patient Present under Involuntary Commitment? No  IVC Papers Initial File Date: No data recorded  [place] of Residence: [place]  Patient Currently Receiving the Following Services: Medication Management; Individual Therapy  Determination of Need: Emergent (2 hours)  Options For Referral: Inpatient Hospitalization  CCA Biopsychosocial Intake/Chief Complaint:  Mania and anxiety  Current Symptoms/Problems: Impairment  Patient Reported Schizophrenia/Schizoaffective Diagnosis in Past: No data recorded  Strengths: uta  Preferences: uta  Abilities: uta  Type of Services Patient Feels are Needed: uta  Initial Clinical Notes/Concerns:  uta  Mental Health Symptoms Depression:  No data recorded  Duration of Depressive symptoms: No data recorded  Mania:  No data recorded  Anxiety:   No data recorded  Psychosis:  No data recorded  Duration of Psychotic symptoms: No data recorded  Trauma:  No data recorded  Obsessions:  No data recorded  Compulsions:  No data recorded  Inattention:  No data recorded  Hyperactivity/Impulsivity:  No data recorded  Oppositional/Defiant Behaviors:  No data recorded  Emotional Irregularity:  No data recorded  Other Mood/Personality Symptoms:  No data recorded   Mental Status Exam Appearance and self-care  Stature:  No data recorded  Weight:  No data recorded  Clothing:  No data recorded  Grooming:  No data recorded  Cosmetic use:  No data recorded  Posture/gait:  No data recorded  Motor activity:  No data recorded  Sensorium  Attention:  No data recorded  Concentration:  No data recorded  Orientation:  No data recorded  Recall/memory:  No data recorded  Affect and Mood  Affect:  No data recorded  Mood:  No data recorded  Relating  Eye contact:  No data recorded  Facial expression:  No data recorded  Attitude toward examiner:  No data recorded  Thought and Language  Speech flow: No data recorded  Thought content:  No data recorded  Preoccupation:  No data recorded  Hallucinations:  No data recorded  Organization:  No data recorded  Executive  Functions  Fund of Knowledge:  No data recorded  Intelligence:  No data recorded  Abstraction:  No data recorded  Judgement:  No data recorded  Reality Testing:  No data recorded  Insight:  No data recorded  Decision Making:  No data recorded  Social Functioning  Social Maturity:  No data recorded  Social Judgement:  No data recorded  Stress  Stressors:  No data recorded  Coping Ability:  No data recorded  Skill Deficits:  No data recorded  Supports:  No data recorded   Religion:   Leisure/Recreation:   Exercise/Diet:    CCA Employment/Education Employment/Work Situation:   Education:   CCA Family/Childhood History Family and Relationship History: Family history Does patient have children?: No  Childhood History:  Childhood History Additional childhood history information: uta Description of patient's relationship with caregiver when they were a child: uta Patient's description of current relationship with people who raised him/her: uta How were you disciplined when you got in trouble as a child/adolescent?: uta Does patient have siblings?:  (uta) Did patient suffer any verbal/emotional/physical/sexual abuse as a child?:  (uta) Did patient suffer from severe childhood neglect?:  (uta) Has patient ever been sexually abused/assaulted/raped as an adolescent or adult?:  (uta) Was the patient ever a victim of a crime or a disaster?:  (uta) Witnessed domestic violence?:  (uta) Has patient been affected by domestic violence as an adult?:  Industrial/product designer)  Child/Adolescent Assessment:   CCA Substance Use Alcohol/Drug Use: Alcohol / Drug Use Pain Medications: see MAR Prescriptions: see MAR Over the Counter: see MAR History of alcohol / drug use?: No history of alcohol / drug abuse   ASAM's:  Six Dimensions of Multidimensional Assessment  Dimension 1:  Acute Intoxication and/or Withdrawal Potential:      Dimension 2:  Biomedical Conditions and Complications:      Dimension 3:  Emotional, Behavioral, or Cognitive Conditions and Complications:     Dimension 4:  Readiness to Change:     Dimension 5:  Relapse, Continued use, or Continued Problem Potential:     Dimension 6:  Recovery/Living Environment:     ASAM Severity Score:    ASAM Recommended Level of Treatment:     Substance use Disorder (SUD)   Recommendations for Services/Supports/Treatments: Recommendations for Services/Supports/Treatments Recommendations For Services/Supports/Treatments: Inpatient Hospitalization  DSM5 Diagnoses: Patient  Active Problem List   Diagnosis Date Noted  . Bacterial vaginal infection 08/27/2012  . 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 WEIGHT LOSS 06/19/2006   Patient Centered Plan: Patient is on the following Treatment Plan(s):   Referrals to Alternative Service(s): Referred to Alternative Service(s):   Place:   Date:   Time:    Referred to Alternative Service(s):   Place:   Date:   Time:    Referred to Alternative Service(s):   Place:   Date:   Time:    Referred to Alternative Service(s):   Place:   Date:   Time:     Burnetta Sabin, [facility]

## 2020-03-27 NOTE — Progress Notes (Signed)
TOC CM CSW was contacted by [name]/DSS APS [phone].  CSW attempted to return [name]'s call.  CSW left a HIPPA compliant message with my contact information.  Ricquita Tarpley-Carter, MSW, LCSW-A Pronouns:  She, Her, Hers                  [facility] Transitions of CareClinical Social Worker [email or web address] [phone]

## 2020-03-27 NOTE — BH Assessment (Signed)
Disposition Nira Conn, NP, meets inpatient criteria. Geropsychiatry inpatient recommended. Vyskocil, RN informed of disposition. Disposition SW to secure placement.

## 2020-03-27 NOTE — ED Notes (Signed)
Patient is having TTS consult at this time

## 2020-03-27 NOTE — ED Notes (Signed)
Patient given dinner tray.

## 2020-03-27 NOTE — ED Notes (Addendum)
Assumed care of pt at this time. Pt resting in stretcher, sleeping, breathing even ad unlabored. No s/sx of acute distress at this time.   Lab call regarding pt COVID (+) status, MD Cardama made aware. Isolation status updated in epic.

## 2020-03-27 NOTE — ED Notes (Signed)
Pt cont sleeping in stretcher intermittently, breathing even and unlabored. NAD. Pending placement at this time.

## 2020-03-27 NOTE — ED Notes (Signed)
Patient was given her lunch tray. 

## 2020-03-28 ENCOUNTER — Encounter (HOSPITAL_COMMUNITY): Payer: Self-pay | Admitting: Registered Nurse

## 2020-03-28 DIAGNOSIS — Z046 Encounter for general psychiatric examination, requested by authority: Secondary | ICD-10-CM

## 2020-03-28 DIAGNOSIS — F200 Paranoid schizophrenia: Secondary | ICD-10-CM | POA: Diagnosis not present

## 2020-03-28 DIAGNOSIS — Z659 Problem related to unspecified psychosocial circumstances: Secondary | ICD-10-CM | POA: Diagnosis not present

## 2020-03-28 LAB — URINALYSIS, ROUTINE W REFLEX MICROSCOPIC
Bilirubin Urine: NEGATIVE
Glucose, UA: NEGATIVE mg/dL
Hgb urine dipstick: NEGATIVE
Ketones, ur: 20 mg/dL — AB
Leukocytes,Ua: NEGATIVE
Nitrite: NEGATIVE
Protein, ur: NEGATIVE mg/dL
Specific Gravity, Urine: 1.014 (ref 1.005–1.030)
pH: 5 (ref 5.0–8.0)

## 2020-03-28 NOTE — Progress Notes (Signed)
TOC CM CSW spoke with both [name]/pts sister [phone] and [name]/pts aunt [phone]. CSW spoke with family about pts living situation, POA, and LTC.  Pt can not be held in ED.  Pt will go home with HH.  But other arrangements need to be made by POA/legal guardian and family.  CSW will also notify [name]/DSS APS [phone].  CSW will continue to follow for dc needs.  [name], MSW, LCSW-A Pronouns:  She, Her, Hers                  [name] ED Transitions of CareClinical Social Worker [email or web address] [phone]

## 2020-03-28 NOTE — Discharge Instructions (Addendum)
For your behavioral health needs you are advised to continue treatment with [facility].  You have a tele-health appointment with them scheduled for Friday, March 30, 2020 at 12:00 pm:       [facility]      [address], [address]      [place], [place] [address]      [phone]

## 2020-03-28 NOTE — Progress Notes (Signed)
TOC CM CSW has been speaking with family.  They are currently looking at facilities to place pt and legal guardianship.  Family and [name]/DSS APS is concerned with pt going home alone.    CSW will continue to follow for dc needs.  Ricquita Tarpley-Carter, MSW, LCSW-A Pronouns:  She, Her, Hers                  [facility] Transitions of CareClinical Social Worker [email or web address] [phone]

## 2020-03-28 NOTE — BH Assessment (Addendum)
[facility] Assessment Progress Note  Per Emily Rankin, NP, this pt does not require psychiatric hospitalization at this time.  Pt presents under IVC initiated by an agent/broker working for pt and upheld by EDP Emily Partridge, MD, which has been rescinded by EDP Emily Score, MD.  Pt is psychiatrically cleared.  Discharge instructions advise pt to continue treatment with [facility].  A TOC consult has been ordered to address pt's psychosocial needs.  Dr [name] and pt's nurse, [name], have been notified.  Emily Canning, MA Triage Specialist [phone]   Addendum:  Per Emily's request, this writer called [name] to try to arrange for continuity of care.  Call rolled to voice mail and I left a detailed message informing them that pt is likely to run out of psychotropic medications in a week and requesting a follow-up appointment.  Return call is pending as of this writing.  Please note that [facility] historically does not necessarily return my calls within 24 hours.  [name], [place] Behavioral Health Coordinator [phone]   Addendum:  At 11:29 [name] calls back from [facility].  Pt has a tele-health appointment with them scheduled for Friday, 03/30/2020 at 12:00.  This has been included in pt's discharge instructions.  Emily has been notified.  [name], [place] Behavioral Health Coordinator [phone]

## 2020-03-28 NOTE — Consult Note (Signed)
Telepsych Consultation   Reason for Consult:  IVC, history of schizophrenia, unable to care for self Referring Physician:  Renne Crigler, PA-C Location of Patient: [facility] ED Location of Provider: Other: [facility]  Patient Identification: [name] MRN:  [id number] Principal Diagnosis: Problem related to psychosocial circumstances Diagnosis:  Principal Problem:   Problem related to psychosocial circumstances Active Problems:   SCHIZOPHRENIA, PARANOID, CHRONIC   Involuntary commitment   Total Time spent with patient: 30 minutes  Subjective:   [name] is a 70 y.o. female patient admitted to [facility] ED after presenting via law enforcement who petition IVC with complaints of manic behavior, patient leaving her home with oven on, feces smeared on walls, and patient unable to care for herself, not eating or bathing.  HPI:  [name], 70 y.o., female patient seen via tele health by this provider, consulted with Dr. Nelly Rout; and chart reviewed on 03/28/20.  On evaluation when Margot Chimes asked why she was in hospital she began by informing her move on her on 2 yrs ago with the assistance of a family member that was visiting weekly, then biweekly, to monthly, and eventually the family member didn't have time to come because of problems of their own.  Patient also stated she had thoughts of moving about 2 months ago but couldn't get any help.  States she had a visit from her sister and brother in law about a month ago.  Patient reports that she lives alone and that she is able to carry out most of her ADLs but may need help sometimes.   Patient states she has been diagnosed with paranoid schizophrenia and is treated by Vesta Mixer "I've been doing telephone visits cause bout a year ago when stopped going to office I didn't have a monitor for them to see me on so do by phone."  Patient states she has been compliant with her medications; she prescribed Prozac and Abilify and will need  refills in a week.  Patient states she has never tried to kill herself in the past and denies suicidal/self-harm ideation at this time.  Patient denies a history of violence and homicidal ideation.  Patient also denies auditory/visual hallucination, and paranoia.  Patient states that she does have trouble remembering some things and may have to think on it for a while.   Patient was able to give correct information for her DOB, age, current place, city, state, county.  When asked the name of current president patient stated "I've been out of the media for about 6 months with no TV but after thinking for a moment she was able to give the name "Biden" States she can see the president prior to [place] but can get his name.   Patient states she has been sleeping and eating without difficulty, tolerating medications without adverse reactions. During evaluation [name] is laying on her side in bed in no acute distress.  She is alert, oriented x 3.  Patient remained calm and cooperative throughout assessment.  Her mood is euthymic with congruent affect.  Patients' conversation is circumstantial with rumination.  She does not appear to be responding to internal/external stimuli or delusional thoughts.  Patient denies suicidal/self-harm/homicidal ideation, psychosis, and paranoia.  Patient answered question appropriately.  Past Psychiatric History: Paranoid schizophrenia  Risk to Self:  No risk suicidal intent; but living alone with no assistance may be problematic for patient Risk to Others:  No Prior Inpatient Therapy:  Yes Prior Outpatient  Therapy:  Yes  Past Medical History:  Past Medical History:  Diagnosis Date  . Depression   . Schizophrenia [facility])     Past Surgical History:  Procedure Laterality Date  . SPINE SURGERY     Family History:  Family History  Problem Relation Age of Onset  . Heart disease Mother   . Breast cancer Neg Hx    Family Psychiatric  History: None reported Social  History:  Social History   Substance and Sexual Activity  Alcohol Use No     Social History   Substance and Sexual Activity  Drug Use No    Social History   Socioeconomic History  . Marital status: Single    Spouse name: Not on file  . Number of children: Not on file  . Years of education: Not on file  . Highest education level: Not on file  Occupational History  . Not on file  Tobacco Use  . Smoking status: Never Smoker  . Smokeless tobacco: Not on file  Substance and Sexual Activity  . Alcohol use: No  . Drug use: No  . Sexual activity: Never  Other Topics Concern  . Not on file  Social History Narrative  . Not on file   Social Determinants of Health   Financial Resource Strain: Not on file  Food Insecurity: Not on file  Transportation Needs: Not on file  Physical Activity: Not on file  Stress: Not on file  Social Connections: Not on file   Additional Social History:    Allergies:  No Known Allergies  Labs:  Results for orders placed or performed during the hospital encounter of 03/26/20 (from the past 48 hour(s))  Urine rapid drug screen (hosp performed)     Status: None   Collection Time: 03/26/20  6:45 PM  Result Value Ref Range   Opiates NONE DETECTED NONE DETECTED   Cocaine NONE DETECTED NONE DETECTED   Benzodiazepines NONE DETECTED NONE DETECTED   Amphetamines NONE DETECTED NONE DETECTED   Tetrahydrocannabinol NONE DETECTED NONE DETECTED   Barbiturates NONE DETECTED NONE DETECTED    Comment: (NOTE) DRUG SCREEN FOR MEDICAL PURPOSES ONLY.  IF CONFIRMATION IS NEEDED FOR ANY PURPOSE, NOTIFY LAB WITHIN 5 DAYS.  LOWEST DETECTABLE LIMITS FOR URINE DRUG SCREEN Drug Class                     Cutoff (ng/mL) Amphetamine and metabolites    1000 Barbiturate and metabolites    200 Benzodiazepine                 200 Tricyclics and metabolites     300 Opiates and metabolites        300 Cocaine and metabolites        300 THC                             50 Performed at [facility], [address], [place], [place] [address]   Comprehensive metabolic panel     Status: Abnormal   Collection Time: 03/26/20  7:49 PM  Result Value Ref Range   Sodium 139 135 - 145 mmol/L   Potassium 3.7 3.5 - 5.1 mmol/L   Chloride 101 98 - 111 mmol/L   CO2 28 22 - 32 mmol/L   Glucose, Bld 116 (H) 70 - 99 mg/dL    Comment: Glucose reference range applies only to samples taken after fasting for at  least 8 hours.   BUN 17 8 - 23 mg/dL   Creatinine, Ser 8.10 0.44 - 1.00 mg/dL   Calcium 9.4 8.9 - 17.5 mg/dL   Total Protein 8.1 6.5 - 8.1 g/dL   Albumin 4.4 3.5 - 5.0 g/dL   AST 22 15 - 41 U/L   ALT 15 0 - 44 U/L   Alkaline Phosphatase 88 38 - 126 U/L   Total Bilirubin 0.8 0.3 - 1.2 mg/dL   GFR, Estimated >10 >25 mL/min    Comment: (NOTE) Calculated using the CKD-EPI Creatinine Equation (2021)    Anion gap 10 5 - 15    Comment: Performed at [facility], [address], [place], [place] [address]  Ethanol     Status: None   Collection Time: 03/26/20  7:49 PM  Result Value Ref Range   Alcohol, Ethyl (B) <10 <10 mg/dL    Comment: (NOTE) Lowest detectable limit for serum alcohol is 10 mg/dL.  For medical purposes only. Performed at [facility], [address] [place], [place] [address]   CBC with Diff     Status: Abnormal   Collection Time: 03/26/20  7:49 PM  Result Value Ref Range   WBC 5.5 4.0 - 10.5 K/uL   RBC 4.70 3.87 - 5.11 MIL/uL   Hemoglobin 14.4 12.0 - 15.0 g/dL   HCT 53.6 14.4 - 31.5 %   MCV 95.5 80.0 - 100.0 fL   MCH 30.6 26.0 - 34.0 pg   MCHC 32.1 30.0 - 36.0 g/dL   RDW 40.0 86.7 - 61.9 %   Platelets 282 150 - 400 K/uL   nRBC 0.0 0.0 - 0.2 %   Neutrophils Relative % 79 %   Neutro Abs 4.4 1.7 - 7.7 K/uL   Lymphocytes Relative 12 %   Lymphs Abs 0.6 (L) 0.7 - 4.0 K/uL   Monocytes Relative 7 %   Monocytes Absolute 0.4 0.1 - 1.0 K/uL   Eosinophils Relative 0 %   Eosinophils  Absolute 0.0 0.0 - 0.5 K/uL   Basophils Relative 1 %   Basophils Absolute 0.0 0.0 - 0.1 K/uL   Immature Granulocytes 1 %   Abs Immature Granulocytes 0.03 0.00 - 0.07 K/uL    Comment: Performed at [facility], [address], [place], [place] [address]    Medications:  Current Facility-Administered Medications  Medication Dose Route Frequency Provider Last Rate Last Admin  . FLUoxetine (PROZAC) capsule 20 mg  20 mg Oral Daily Avir Deruiter B, NP   20 mg at 03/27/20 1359  . OLANZapine (ZYPREXA) tablet 7.5 mg  7.5 mg Oral QHS Brannan Cassedy B, NP   7.5 mg at 03/27/20 2159   No current outpatient medications on file.    Musculoskeletal: Strength & Muscle Tone: within normal limits Gait & Station: normal Patient leans: N/A  Psychiatric Specialty Exam: Physical Exam Vitals and nursing note reviewed. Chaperone present: Sitter at bedside.  Constitutional:      General: She is not in acute distress.    Appearance: Normal appearance. She is not ill-appearing.  Cardiovascular:     Rate and Rhythm: Normal rate.  Pulmonary:     Effort: Pulmonary effort is normal.  Musculoskeletal:        General: Normal range of motion.     Cervical back: Normal range of motion.  Neurological:     Mental Status: She is alert and oriented to person, place, and time.  Psychiatric:  Attention and Perception: Attention and perception normal. She does not perceive auditory or visual hallucinations.        Mood and Affect: Mood and affect normal.        Speech: Speech normal.        Behavior: Behavior normal. Behavior is cooperative.        Thought Content: Thought content normal. Thought content is not paranoid or delusional. Thought content does not include homicidal or suicidal ideation.        Cognition and Memory: Cognition normal.        Judgment: Judgment normal.     Review of Systems  Constitutional: Negative.  Negative for appetite change.  HENT: Negative.   Eyes:  Negative.   Respiratory: Negative.  Negative for shortness of breath.   Cardiovascular: Negative.   Gastrointestinal: Negative.   Genitourinary: Negative.   Musculoskeletal: Negative.   Skin: Negative.   Neurological: Negative.   Hematological: Negative.   Psychiatric/Behavioral: Negative for agitation, behavioral problems, hallucinations, self-injury, sleep disturbance and suicidal ideas. The patient is not hyperactive. Nervous/anxious: Stable.        Patient states she has a history of paranoid schizophrenia and has outpatient psychiatric services at [facility].  She is prescribed Prozac and Abilify that she takes daily as prescribed.  States she is due for a refill "in bout a week."    Patient denies any auditory/visual hallucinations, paranoia, and suicidal/homicidal/self-harm ideation.  Also denies prior suicide attempt.    Blood pressure 118/75, pulse 88, temperature 98 F (36.7 C), temperature source Oral, resp. rate 20, SpO2 99 %.There is no height or weight on file to calculate BMI.  General Appearance: Casual  In hospital paper scrubs  Eye Contact:  Good  Speech:  Clear and Coherent and Normal Rate  Volume:  Normal  Mood:  Euthymic  Affect:  Appropriate and Congruent  Thought Process:  Coherent and Descriptions of Associations: Circumstantial  Orientation:  Full (Time, Place, and Person)  Thought Content:  WDL and Rumination  Suicidal Thoughts:  No  Homicidal Thoughts:  No  Memory:  Immediate;   Fair Recent;   Fair  Judgement:  Intact  Insight:  Present  Psychomotor Activity:  Normal  Concentration:  Concentration: Fair and Attention Span: Fair  Recall:  [employer] of Knowledge:  Fair  Language:  Good  Akathisia:  No  Handed:  Right  AIMS (if indicated):     Assets:  Communication Skills Desire for Improvement Financial Resources/Insurance Housing Resilience  ADL's:  Intact, but may need assistance with some activities  Cognition:  WNL  Sleep:      Treatment  Plan Summary: Plan Psychiatrically clear.  Behavioral Health Coordinator will contact [facility] to assure aware of patients hospitalization, and that she is able to get refills on her psychotropic medications.  Social Work/TOC consult ordered to address other psychosocial needs of patient such as the possible need for home health nurse or skilled nursing placement.  Disposition:  Psychiatrically cleared No evidence of imminent risk to self or others at present.   Patient does not meet criteria for psychiatric inpatient admission. Supportive therapy provided about ongoing stressors. Discussed crisis plan, support from social network, calling 911, coming to the Emergency Department, and calling Suicide Hotline.  This service was provided via telemedicine using a 2-way, interactive audio and video technology.  Names of all persons participating in this telemedicine service and their role in this encounter. Name: Assunta Found Role: NP  Name: Dr. Nelly Rout  Role: Psychiatrist  Name: [name] Role: Patient  Name: Dr. Meridee ScoreMichael Butler Role: Lucien MonsWL EDP sent a secure message informing:  Patient has been seen and psychiatrically cleared.  Behavioral Health Coordinator will contact [facility] to assist with patient follow up after discharge and inform the need of medication refills.  Social work/TOC consult ordered to assist with other psychosocial issues patient may have related to living alone and possible needing home health nurse or skilled nursing facility placement.      Selim Durden, NP 03/28/2020 9:58 AM

## 2020-03-28 NOTE — ED Provider Notes (Signed)
Emergency Medicine Observation Re-evaluation Note  [name] is a 70 y.o. female, seen on rounds today.  Pt initially presented to the ED for complaints of IVC Currently, the patient is sleeping.  Physical Exam  BP 118/75 (BP Location: Right Arm)   Pulse 88   Temp 98 F (36.7 C) (Oral)   Resp 20   SpO2 99%  Physical Exam General: No acute distress Cardiac: Well-perfused Lungs: Nonlabored Psych: Cooperative  ED Course / MDM  EKG:EKG Interpretation  Date/Time:  Tuesday March 27 2020 02:19:24 EDT Ventricular Rate:  64 PR Interval:    QRS Duration: 84 QT Interval:  404 QTC Calculation: 417 R Axis:   90 Text Interpretation: Sinus rhythm Borderline right axis deviation No acute changes Confirmed by Drema Pry [phone]) on 03/27/2020 2:40:58 AM   I have reviewed the labs performed to date as well as medications administered while in observation.  Recent changes in the last 24 hours include patient tested positive for Covid.  TTS is recommending Ger[facility] psych placement.  Plan  Current plan is for [facility] psych placement. Patient is under full IVC at this time.   Terrilee Files, MD 03/28/20 [phone]

## 2020-03-28 NOTE — Progress Notes (Signed)
Chaplain received phone call from pt's brother-in-law, [name].  Consulted with SW.  Engaged in HIPPA-compliant conversation around support for [employer], pursuit of guardianship.  Provided education and support around family's seeking to assist Ms. [name].     Mr [name] states that pt has sister, [name], older brother, and mother.  Mother lives in [place].  The family is going to consult around next-of-kin and default HCPOA if [name] is not able to speak for themself.    Referred Mr [name] to SW to speak about possible APS consult and pursuit of guardianship

## 2020-03-29 NOTE — Progress Notes (Addendum)
TOC CM CSW contacted Fayrene Fearing Stokes/pts brother-in-law [phone]. CSW called to inquire about the time of pick up for pt today.  Family stated they were unable to pick up pt today, but they are actively seeking LTC or Assisted Living Facility for pt.  Family is also actively seeking legal guardianship of pt.    CSW advised Fayrene Fearing to continue to reach out to the facilities in he and his wife's area first, due to the pt being in [place] alone.     CSW will continue to follow for dc needs.  Tremaine Earwood Tarpley-Carter, MSW, LCSW-A Pronouns:  She, Her, Hers                  [facility] Transitions of CareClinical Social Worker [email or web address] [phone]

## 2020-03-29 NOTE — Progress Notes (Signed)
TOC CM CSW spoke with Denise/pts landlord verified pts address as to [address], [place], [place], [place]  [address].  CSW engaged in HIPPA-compliant conversation around pts address.  CSW will continue to follow for dc needs.  Ricquita Tarpley-Carter, MSW, LCSW-A Pronouns:  She, Her, Hers                  [facility] Transitions of CareClinical Social Worker [email or web address] [phone]

## 2020-03-29 NOTE — Progress Notes (Signed)
TOC CM CSW received a call from Fayrene Fearing Stokes/pts brother-in-law [phone].  Fayrene Fearing had been given permission by Rojelio Brenner to [employer] and CSW.  CSW engaged with Fayrene Fearing' questions regarding assistance with LTC or ALF, but remaining HIPPA compliant.  CSW advised Fayrene Fearing to reach out to the facilities in he and his wife's area first, due to the pt being in [place] alone.    CSW will continue to follow for dc needs.  Ricquita Tarpley-Carter, MSW, LCSW-A Pronouns:  She, Her, Hers                  [facility] Transitions of CareClinical Social Worker [email or web address] [phone]

## 2020-03-29 NOTE — ED Notes (Signed)
Pt DC off unit to home per provider. Pt alert, calm, cooperative, no s/s of distress.  DC information given to and reviewed with pt. Belongings given to pt.  Pt ambulatory off unit, escorted by RN. Pt transported by [employer], set by SW.

## 2020-03-29 NOTE — ED Provider Notes (Signed)
Notified by [facility] team, pt is ready for discharge.   Linwood Dibbles, MD 03/29/20 [phone]

## 2020-03-29 NOTE — Progress Notes (Signed)
TOC CM referral to arrange HH. Offered choice to pt and agreeable to agency that will accept referral. Isidoro Donning RN CCM, [facility] ED TOC CM [phone]

## 2020-03-29 NOTE — ED Provider Notes (Signed)
Emergency Medicine Observation Re-evaluation Note  [name] is a 70 y.o. female, seen on rounds today.  Pt initially presented to the ED for complaints of Unable to care for self Currently, the patient is awaiting assistance for placement.  Physical Exam  BP 98/74 (BP Location: Right Arm)   Pulse 86   Temp 97.6 F (36.4 C) (Oral)   Resp 18   SpO2 98%  Physical Exam General: Nontoxic Cardiac: Normal heart rate Lungs: Normal respiratory rate Psych: Calm  ED Course / MDM  EKG:EKG Interpretation  Date/Time:  Tuesday March 27 2020 02:19:24 EDT Ventricular Rate:  64 PR Interval:    QRS Duration: 84 QT Interval:  404 QTC Calculation: 417 R Axis:   90 Text Interpretation: Sinus rhythm Borderline right axis deviation No acute changes Confirmed by Drema Pry [phone]) on 03/27/2020 2:40:58 AM   I have reviewed the labs performed to date as well as medications administered while in observation.  Recent changes in the last 24 hours include arrangements have been made for a telehealth visit with [facility] for psychiatric evaluation and care on an ongoing basis.  This is scheduled for tomorrow at noon.  According to nursing staff, with the patient at this time, this cannot be performed while the patient is in the psychiatric ED.  Plan  Current plan is for continued management by [facility] staff, possible placement due to inability to care for self. Patient is not under full IVC at this time.   Mancel Bale, MD 03/29/20 [phone]

## 2020-04-03 NOTE — Progress Notes (Signed)
04/03/2020 209 pm [facility] confirmed they will accept for [facility], spoke to rep, Amy. Pt currently PCP has passed away. Agency will work on getting her set up with PCP. Reached on to [facility] on call, Dr Sherre Lain, and he will sign HH orders until pt establishes with new PCP. Updated [facility] rep, Amy with information. Isidoro Donning RN CCM, [facility] ED TOC CM [phone]

## 2020-09-17 ENCOUNTER — Encounter (HOSPITAL_COMMUNITY): Payer: Self-pay

## 2020-09-17 ENCOUNTER — Ambulatory Visit (HOSPITAL_COMMUNITY): Payer: Medicare Other | Admitting: Student in an Organized Health Care Education/Training Program

## 2020-10-27 ENCOUNTER — Emergency Department (HOSPITAL_COMMUNITY)
Admission: EM | Admit: 2020-10-27 | Discharge: 2020-10-27 | Disposition: A | Discharge status: Home / Self Care | Payer: Medicare Other | Attending: Emergency Medicine | Admitting: Emergency Medicine

## 2020-10-27 ENCOUNTER — Other Ambulatory Visit: Payer: Self-pay

## 2020-10-27 ENCOUNTER — Encounter (HOSPITAL_COMMUNITY): Payer: Self-pay | Admitting: Emergency Medicine

## 2020-10-27 DIAGNOSIS — R109 Unspecified abdominal pain: Secondary | ICD-10-CM | POA: Diagnosis not present

## 2020-10-27 DIAGNOSIS — R1084 Generalized abdominal pain: Secondary | ICD-10-CM

## 2020-10-27 LAB — CBC
HCT: 40.9 % (ref 36.0–46.0)
Hemoglobin: 13.5 g/dL (ref 12.0–15.0)
MCH: 30.8 pg (ref 26.0–34.0)
MCHC: 33 g/dL (ref 30.0–36.0)
MCV: 93.2 fL (ref 80.0–100.0)
Platelets: 315 10*3/uL (ref 150–400)
RBC: 4.39 MIL/uL (ref 3.87–5.11)
RDW: 13.2 % (ref 11.5–15.5)
WBC: 4.9 10*3/uL (ref 4.0–10.5)
nRBC: 0 % (ref 0.0–0.2)

## 2020-10-27 LAB — URINALYSIS, ROUTINE W REFLEX MICROSCOPIC
Bilirubin Urine: NEGATIVE
Glucose, UA: NEGATIVE mg/dL
Hgb urine dipstick: NEGATIVE
Ketones, ur: 20 mg/dL — AB
Leukocytes,Ua: NEGATIVE
Nitrite: NEGATIVE
Protein, ur: NEGATIVE mg/dL
Specific Gravity, Urine: 1.028 (ref 1.005–1.030)
pH: 5 (ref 5.0–8.0)

## 2020-10-27 LAB — COMPREHENSIVE METABOLIC PANEL
ALT: 18 U/L (ref 0–44)
AST: 26 U/L (ref 15–41)
Albumin: 4.1 g/dL (ref 3.5–5.0)
Alkaline Phosphatase: 73 U/L (ref 38–126)
Anion gap: 8 (ref 5–15)
BUN: 20 mg/dL (ref 8–23)
CO2: 28 mmol/L (ref 22–32)
Calcium: 9.1 mg/dL (ref 8.9–10.3)
Chloride: 99 mmol/L (ref 98–111)
Creatinine, Ser: 0.61 mg/dL (ref 0.44–1.00)
GFR, Estimated: >60 mL/min (ref >60)
Glucose, Bld: 105 mg/dL — ABNORMAL HIGH (ref 70–99)
Potassium: 4 mmol/L (ref 3.5–5.1)
Sodium: 135 mmol/L (ref 135–145)
Total Bilirubin: 0.9 mg/dL (ref 0.3–1.2)
Total Protein: 7.2 g/dL (ref 6.5–8.1)

## 2020-10-27 LAB — LIPASE, BLOOD: Lipase: 42 U/L (ref 11–51)

## 2020-10-27 NOTE — ED Provider Notes (Signed)
[facility]EMERGENCY DEPT Provider Note   CSN: [id number] Arrival date & time: 10/27/20  0209     History Chief Complaint  Patient presents with   Abdominal Pain    [name] is a 70 y.o. female.  Patient to ED with abdominal pain after eating chicken nuggets from [employer] yesterday around 3:00 pm. Symptoms started about one hour after eating. No nausea or vomiting. No fever. No diarrhea. The pain has completely resolved and she has no further symptoms.   The history is provided by the patient. No language interpreter was used.  Abdominal Pain Associated symptoms: no chest pain, no constipation, no diarrhea, no fever, no nausea, no shortness of breath and no vomiting       Past Medical History:  Diagnosis Date   Depression    Schizophrenia [facility])     Patient Active Problem List   Diagnosis Date Noted   Involuntary commitment 03/28/2020   Problem related to psychosocial circumstances 03/28/2020   Bacterial vaginal infection 08/27/2012   SCHIZOPHRENIA, PARANOID, CHRONIC 06/19/2006   WEIGHT LOSS 06/19/2006    Past Surgical History:  Procedure Laterality Date   SPINE SURGERY       OB History   No obstetric history on file.     Family History  Problem Relation Age of Onset   Heart disease Mother    Breast cancer Neg Hx     Social History   Tobacco Use   Smoking status: Never  Substance Use Topics   Alcohol use: No   Drug use: No    Home Medications Prior to Admission medications   Not on File    Allergies    Patient has no known allergies.  Review of Systems   Review of Systems  Constitutional:  Negative for fever.  Respiratory:  Negative for shortness of breath.   Cardiovascular:  Negative for chest pain.  Gastrointestinal:  Positive for abdominal pain. Negative for constipation, diarrhea, nausea and vomiting.  Skin:  Negative for rash.   Physical Exam Updated Vital Signs BP (!) 108/56   Pulse 66   Temp 98.7  F (37.1 C) (Oral)   Resp 16   SpO2 100%   Physical Exam Vitals and nursing note reviewed.  Constitutional:      General: She is not in acute distress.    Appearance: She is well-developed. She is not ill-appearing.     Comments: Underweight, cachectic  Cardiovascular:     Rate and Rhythm: Normal rate.  Pulmonary:     Effort: Pulmonary effort is normal.  Abdominal:     General: There is no distension.     Tenderness: There is no abdominal tenderness.  Musculoskeletal:        General: Normal range of motion.     Cervical back: Normal range of motion.  Skin:    General: Skin is warm and dry.  Neurological:     Mental Status: She is alert and oriented to person, place, and time.    ED Results / Procedures / Treatments   Labs (all labs ordered are listed, but only abnormal results are displayed) Labs Reviewed  COMPREHENSIVE METABOLIC PANEL - Abnormal; Notable for the following components:      Result Value   Glucose, Bld 105 (*)    All other components within normal limits  URINALYSIS, ROUTINE W REFLEX MICROSCOPIC - Abnormal; Notable for the following components:   Ketones, ur 20 (*)    All other components within normal limits  LIPASE, BLOOD  CBC    EKG None  Radiology No results found.  Procedures Procedures   Medications Ordered in ED Medications - No data to display  ED Course  I have reviewed the triage vital signs and the nursing notes.  Pertinent labs & imaging results that were available during my care of the patient were reviewed by me and considered in my medical decision making (see chart for details).    MDM Rules/Calculators/A&P                           Patient to ED with abdominal pain after eating fast food, now resolved.   Labs unremarkable. She is sleeping soundly. VSS. She is felt appropriate for discharge home.   Final Clinical Impression(s) / ED Diagnoses Final diagnoses:  None   Abdominal pain, resolved  Rx / DC Orders ED  Discharge Orders     None        Elpidio Anis, PA-C 10/27/20 0550    Gilda Crease, MD 10/27/20 [phone]

## 2020-10-27 NOTE — ED Triage Notes (Signed)
Pt reports eating spicy chicken nuggets 9 hours ago and now c/o abd pain. Pain is constant in RLQ. Also endorses BL leg pain. Denies n/v.

## 2020-10-29 ENCOUNTER — Encounter (HOSPITAL_COMMUNITY): Payer: Self-pay

## 2020-10-29 ENCOUNTER — Emergency Department (HOSPITAL_COMMUNITY)
Admission: EM | Admit: 2020-10-29 | Discharge: 2020-10-30 | Discharge status: Against Medical Advice | Payer: Medicare Other | Attending: Emergency Medicine | Admitting: Emergency Medicine

## 2020-10-29 ENCOUNTER — Other Ambulatory Visit: Payer: Self-pay

## 2020-10-29 DIAGNOSIS — E876 Hypokalemia: Secondary | ICD-10-CM

## 2020-10-29 DIAGNOSIS — M5441 Lumbago with sciatica, right side: Secondary | ICD-10-CM | POA: Diagnosis not present

## 2020-10-29 DIAGNOSIS — M79671 Pain in right foot: Secondary | ICD-10-CM | POA: Insufficient documentation

## 2020-10-29 DIAGNOSIS — T7840XA Allergy, unspecified, initial encounter: Secondary | ICD-10-CM | POA: Diagnosis not present

## 2020-10-29 DIAGNOSIS — M5431 Sciatica, right side: Secondary | ICD-10-CM | POA: Diagnosis not present

## 2020-10-29 LAB — CBC WITH DIFFERENTIAL/PLATELET
Abs Immature Granulocytes: 0.01 10*3/uL (ref 0.00–0.07)
Basophils Absolute: 0 10*3/uL (ref 0.0–0.1)
Basophils Relative: 1 %
Eosinophils Absolute: 0 10*3/uL (ref 0.0–0.5)
Eosinophils Relative: 1 %
HCT: 41.5 % (ref 36.0–46.0)
Hemoglobin: 14.1 g/dL (ref 12.0–15.0)
Immature Granulocytes: 0 %
Lymphocytes Relative: 21 %
Lymphs Abs: 1.1 10*3/uL (ref 0.7–4.0)
MCH: 31.8 pg (ref 26.0–34.0)
MCHC: 34 g/dL (ref 30.0–36.0)
MCV: 93.7 fL (ref 80.0–100.0)
Monocytes Absolute: 0.6 10*3/uL (ref 0.1–1.0)
Monocytes Relative: 11 %
Neutro Abs: 3.5 10*3/uL (ref 1.7–7.7)
Neutrophils Relative %: 66 %
Platelets: 347 10*3/uL (ref 150–400)
RBC: 4.43 MIL/uL (ref 3.87–5.11)
RDW: 13.2 % (ref 11.5–15.5)
WBC: 5.2 10*3/uL (ref 4.0–10.5)
nRBC: 0 % (ref 0.0–0.2)

## 2020-10-29 LAB — BASIC METABOLIC PANEL
Anion gap: 9 (ref 5–15)
BUN: 19 mg/dL (ref 8–23)
CO2: 28 mmol/L (ref 22–32)
Calcium: 9.3 mg/dL (ref 8.9–10.3)
Chloride: 102 mmol/L (ref 98–111)
Creatinine, Ser: 0.66 mg/dL (ref 0.44–1.00)
GFR, Estimated: >60 mL/min (ref >60)
Glucose, Bld: 83 mg/dL (ref 70–99)
Potassium: 3.3 mmol/L — ABNORMAL LOW (ref 3.5–5.1)
Sodium: 139 mmol/L (ref 135–145)

## 2020-10-29 NOTE — Progress Notes (Signed)
Post d/c  Pt has been sleeping in waiting are since d/c on Saturday. Pt's comments does not make sense. Pt stated she needs her SS card, pt stated her feet is making her stomach hurt. Pt will check back in due to feet and stomach pain at this time.    Valentina Shaggy.Leather Estis, MSW, LCSWA [facility]  Transitions of Care Clinical Social Worker I Direct Dial: [phone]  Fax: [phone] [email or web address]

## 2020-10-29 NOTE — ED Triage Notes (Signed)
Pt reports left foot pain. Pt has been in the lobby for 2 days after being discharged. Case management went to see her today in the lobby and reported that she was confused. Pt checked back in for foot pain.

## 2020-10-29 NOTE — ED Provider Notes (Signed)
Emergency Medicine Provider Triage Evaluation Note  [name] , a 70 y.o. female  was evaluated in triage.  Pt complains of left foot pain.  Patient has been in the lobby for 2 days after being discharged due to abdominal pain.  Patient states she usually lives at a motel however, recently ran out of money.  Patient was valued by case management today and was concerned about confusion. Patient AAOx4 in triage and answering questions appropriately.   Review of Systems  Positive: Foot pain Negative: fever  Physical Exam  There were no vitals taken for this visit. Gen:   Awake, no distress   Resp:  Normal effort  MSK:   Moves extremities without difficulty  Other:  Blister to back of left foot  Medical Decision Making  Medically screening exam initiated at 5:14 PM.  Appropriate orders placed.  [name] was informed that the remainder of the evaluation will be completed by another provider, this initial triage assessment does not replace that evaluation, and the importance of remaining in the ED until their evaluation is complete.  Given possible confusion, will obtain routine labs Blister to left foot   [name] 10/29/20 1717    Eber Hong, MD 10/29/20 2040

## 2020-10-30 ENCOUNTER — Emergency Department (HOSPITAL_COMMUNITY)
Admission: EM | Admit: 2020-10-30 | Discharge: 2020-10-30 | Disposition: A | Discharge status: Home / Self Care | Payer: Medicare Other | Attending: Emergency Medicine | Admitting: Emergency Medicine

## 2020-10-30 ENCOUNTER — Encounter (HOSPITAL_COMMUNITY): Payer: Self-pay

## 2020-10-30 ENCOUNTER — Other Ambulatory Visit: Payer: Self-pay

## 2020-10-30 DIAGNOSIS — F209 Schizophrenia, unspecified: Secondary | ICD-10-CM | POA: Insufficient documentation

## 2020-10-30 DIAGNOSIS — M5431 Sciatica, right side: Secondary | ICD-10-CM | POA: Insufficient documentation

## 2020-10-30 DIAGNOSIS — M5441 Lumbago with sciatica, right side: Secondary | ICD-10-CM | POA: Diagnosis not present

## 2020-10-30 DIAGNOSIS — M79671 Pain in right foot: Secondary | ICD-10-CM | POA: Diagnosis present

## 2020-10-30 MED ORDER — NAPROXEN 375 MG PO TABS: 375.0000 mg | ORAL_TABLET | Freq: Two times a day (BID) | ORAL | 0 refills | Status: DC

## 2020-10-30 MED ORDER — PREDNISONE 20 MG PO TABS
60.0000 mg | ORAL_TABLET | Freq: Once | ORAL | Status: AC
  Administered 2020-10-30: 60 mg via ORAL
  Filled 2020-10-30: qty 3

## 2020-10-30 MED ORDER — POTASSIUM CHLORIDE CRYS ER 20 MEQ PO TBCR
40.0000 meq | EXTENDED_RELEASE_TABLET | Freq: Once | ORAL | Status: AC
  Administered 2020-10-30: 40 meq via ORAL
  Filled 2020-10-30: qty 2

## 2020-10-30 MED ORDER — PREDNISONE 50 MG PO TABS: 50.0000 mg | ORAL_TABLET | Freq: Every day | ORAL | 0 refills | Status: DC

## 2020-10-30 MED ORDER — NAPROXEN 375 MG PO TABS
375.0000 mg | ORAL_TABLET | Freq: Once | ORAL | Status: AC
  Administered 2020-10-30: 375 mg via ORAL
  Filled 2020-10-30: qty 1

## 2020-10-30 NOTE — ED Notes (Signed)
Patient approached desk after being called multiple times. Patient states she was asleep.

## 2020-10-30 NOTE — Progress Notes (Signed)
Pt received shelters list and was provided with OP resource from mental health services. Pt must follow up with shelters on her own to find bed. CSW called a few shelters on pt's behalf but they are full. CSW attempted to contact pt's brother Fayrene Fearing [phone]) no response left HIPPA complaint VM.CSW contact pt's sister Rojelio Brenner , she stated pt was supposed to be in a group home but pt decided to leave. She stated pt got her own apartment and dd not pay her rent. Pt has history of not complying with her medication for her schizophrenia. Pt's sister stated pt is more than likely not taking her medication and she will not be able to stay with her until she comply's with medication. Pt's sister provided pt's brother Allyce Bochicchio contact [phone]) CSW contact pt's brother, who stated he can not help pt with a place to stay. Pt will need to follow up with her PCP to get back in her group home if she is looking for placement. Pt is aware of this.   Valentina Shaggy.Enez Monahan, MSW, LCSWA [facility]  Transitions of Care Clinical Social Worker I Direct Dial: [phone]  Fax: [phone] [email or web address]

## 2020-10-30 NOTE — ED Provider Notes (Signed)
[facility]-EMERGENCY DEPT Provider Note   CSN: [id number] Arrival date & time: 10/29/20  1514     History Chief Complaint  Patient presents with   Foot Pain    [name] is a 70 y.o. female.  The history is provided by the patient.  Foot Pain She has history of schizophrenia and comes in with what she feels may be an allergic reaction.  She states she was seen in the emergency department several days ago with an allergic reaction and she is continuing to have problems in her right foot and lower back.  She is actually complaining of pain in those areas with some numbness in her foot.  Pain is rated at 7/10.  Nothing makes it better, nothing makes it worse.  She denies any bowel or bladder dysfunction.  She has not taken anything for it.  As a matter fact, she has actually not left the emergency department waiting room since she was discharged several days ago.   Past Medical History:  Diagnosis Date   Depression    Schizophrenia [facility])     Patient Active Problem List   Diagnosis Date Noted   Involuntary commitment 03/28/2020   Problem related to psychosocial circumstances 03/28/2020   Bacterial vaginal infection 08/27/2012   SCHIZOPHRENIA, PARANOID, CHRONIC 06/19/2006   WEIGHT LOSS 06/19/2006    Past Surgical History:  Procedure Laterality Date   SPINE SURGERY       OB History   No obstetric history on file.     Family History  Problem Relation Age of Onset   Heart disease Mother    Breast cancer Neg Hx     Social History   Tobacco Use   Smoking status: Never  Substance Use Topics   Alcohol use: No   Drug use: No    Home Medications Prior to Admission medications   Medication Sig Start Date End Date Taking? Authorizing Provider  naproxen (NAPROSYN) 375 MG tablet Take 1 tablet (375 mg total) by mouth 2 (two) times daily. 10/30/20  Yes Dione Booze, MD  predniSONE (DELTASONE) 50 MG tablet Take 1 tablet (50 mg total) by mouth  daily. 10/30/20  Yes Dione Booze, MD    Allergies    Patient has no known allergies.  Review of Systems   Review of Systems  All other systems reviewed and are negative.  Physical Exam Updated Vital Signs BP 120/75   Pulse 88   Temp 98.1 F (36.7 C) (Oral)   Resp 16   SpO2 100%   Physical Exam Vitals and nursing note reviewed.  Cachectic 70 year old female, resting comfortably and in no acute distress. Vital signs are normal. Oxygen saturation is 100%, which is normal. Head is normocephalic and atraumatic. PERRLA, EOMI. Oropharynx is clear. Neck is nontender and supple without adenopathy or JVD. Back is nontender and there is no CVA tenderness.  Straight leg raise is negative bilaterally. Lungs are clear without rales, wheezes, or rhonchi. Chest is nontender. Heart has regular rate and rhythm without murmur. Abdomen is soft, flat, nontender. Extremities have no cyanosis or edema, full range of motion is present. Skin is warm and dry.  Chronic rash of atopic dermatitis present on both legs. Neurologic: Mental status is normal, cranial nerves are intact, moves all extremities equally with strength 5/5 in all 4 extremities.  Decreased pinprick sensation is noted over the dorsal and lateral aspect of the right foot and lateral aspect of the right lower leg.  ED Results / Procedures / Treatments   Labs (all labs ordered are listed, but only abnormal results are displayed) Labs Reviewed  BASIC METABOLIC PANEL - Abnormal; Notable for the following components:      Result Value   Potassium 3.3 (*)    All other components within normal limits  CBC WITH DIFFERENTIAL/PLATELET    EKG EKG Interpretation  Date/Time:  Monday October 29 2020 18:03:33 EDT Ventricular Rate:  84 PR Interval:  130 QRS Duration: 75 QT Interval:  355 QTC Calculation: 420 R Axis:   93 Text Interpretation: Sinus rhythm Consider right atrial enlargement Right axis deviation Consider left ventricular  hypertrophy When compared with ECG of 03/27/2020, No significant change was found Confirmed by Dione Booze ([id number]) on 10/29/2020 11:34:34 PM  Procedures Procedures   Medications Ordered in ED Medications  predniSONE (DELTASONE) tablet 60 mg (has no administration in time range)  naproxen (NAPROSYN) tablet 375 mg (has no administration in time range)    ED Course  I have reviewed the triage vital signs and the nursing notes.  MDM Rules/Calculators/A&P                         Right sided lower back and leg pain consistent with sciatica.  Decreased pinprick sensation over dorsal and lateral aspect of the right foot and lateral aspect right lower leg possibly consistent with lumbar radiculopathy.  Old records reviewed confirming recent ED visit which was actually for abdominal pain.  ECG obtained at triage showed no acute changes.  Labs ordered at triage were significant for mild hypokalemia and she is given a dose of oral potassium.  She is discharged with prescriptions for prednisone and naproxen and is referred to her primary care provider for follow-up.  Final Clinical Impression(s) / ED Diagnoses Final diagnoses:  Sciatica, right side  Hypokalemia    Rx / DC Orders ED Discharge Orders          Ordered    naproxen (NAPROSYN) 375 MG tablet  2 times daily        10/30/20 0645    predniSONE (DELTASONE) 50 MG tablet  Daily        10/30/20 0645             Dione Booze, MD 10/30/20 [phone]

## 2020-10-30 NOTE — ED Provider Notes (Signed)
[facility] EMERGENCY DEPARTMENT Provider Note  CSN: [id number] Arrival date & time: 10/30/20 1544    History Chief Complaint  Patient presents with   Foot Pain    [name] is a 70 y.o. female with history of schizophrenia gives a rambling history but is re-directable. She was brought by EMS from [facility] for 'mental health check' but her complaint is that her R foot hurts. She was seen in [facility] for same last night, diagnosed with sciatica and given Rx. She also admits she has recently had issues with her temporary housing. She denies any other acute complaints.    Past Medical History:  Diagnosis Date   Depression    Schizophrenia [facility])     Past Surgical History:  Procedure Laterality Date   SPINE SURGERY      Family History  Problem Relation Age of Onset   Heart disease Mother    Breast cancer Neg Hx     Social History   Tobacco Use   Smoking status: Never   Smokeless tobacco: Never  Vaping Use   Vaping Use: Never used  Substance Use Topics   Alcohol use: No   Drug use: No     Home Medications Prior to Admission medications   Medication Sig Start Date End Date Taking? Authorizing Provider  naproxen (NAPROSYN) 375 MG tablet Take 1 tablet (375 mg total) by mouth 2 (two) times daily. 10/30/20   Dione Booze, MD  predniSONE (DELTASONE) 50 MG tablet Take 1 tablet (50 mg total) by mouth daily. 10/30/20   Dione Booze, MD     Allergies    Patient has no known allergies.   Review of Systems   Review of Systems A comprehensive review of systems was completed and negative except as noted in HPI.    Physical Exam BP (!) 110/97 (BP Location: Left Arm)   Pulse 98   Temp 98.1 F (36.7 C) (Oral)   Resp 14   Ht 5' (1.524 m)   Wt 30.4 kg   SpO2 96%   BMI 13.09 kg/m   Physical Exam Vitals and nursing note reviewed.  Constitutional:      Appearance: Normal appearance.  HENT:     Head: Normocephalic and atraumatic.     Nose: Nose normal.      Mouth/Throat:     Mouth: Mucous membranes are moist.  Eyes:     Extraocular Movements: Extraocular movements intact.     Conjunctiva/sclera: Conjunctivae normal.  Cardiovascular:     Rate and Rhythm: Normal rate.  Pulmonary:     Effort: Pulmonary effort is normal.     Breath sounds: Normal breath sounds.  Abdominal:     General: Abdomen is flat.     Palpations: Abdomen is soft.     Tenderness: There is no abdominal tenderness.  Musculoskeletal:        General: No swelling. Normal range of motion.     Cervical back: Neck supple.  Skin:    General: Skin is warm and dry.  Neurological:     General: No focal deficit present.     Mental Status: She is alert.     Cranial Nerves: No cranial nerve deficit.     Motor: No weakness.  Psychiatric:        Mood and Affect: Mood normal.     Comments: Disorganized, no signs of hallucinations     ED Results / Procedures / Treatments   Labs (all labs ordered are listed, but only  abnormal results are displayed) Labs Reviewed - No data to display  EKG None  Radiology No results found.  Procedures Procedures  Medications Ordered in the ED Medications - No data to display   MDM Rules/Calculators/A&P MDM Patient does not have any acute medical condition. She is baseline schizophrenic, no signs of acute psychosis requiring inpatient psych admission. Recommend PCP follow up for her foot pain. Resources for shelters given. Will give referral to [facility] as well if needed. TOC is familiar with the patient. Apparently when she was here a few days ago, she sat in the lobby for 2-3 days after discharge. She was previously in a facility that sounds like a group home but would require infectious disease testing to return there (HIV, HepC, Covid, etc) which we cannot accomplish in the ED, this will need to be coordinated with PCP.  ED Course  I have reviewed the triage vital signs and the nursing notes.  Pertinent labs & imaging results that were  available during my care of the patient were reviewed by me and considered in my medical decision making (see chart for details).     Final Clinical Impression(s) / ED Diagnoses Final diagnoses:  Sciatica of right side  Schizophrenia, unspecified type [facility])    Rx / DC Orders ED Discharge Orders     None        Pollyann Savoy, MD 10/30/20 1736

## 2020-10-30 NOTE — ED Triage Notes (Signed)
Patient checked in under mental health check, but when asked why she was still here, the patient stated, "I can not walk on my feet. It hurts."

## 2020-12-19 ENCOUNTER — Encounter (HOSPITAL_COMMUNITY): Payer: Self-pay

## 2020-12-19 ENCOUNTER — Other Ambulatory Visit: Payer: Self-pay

## 2020-12-19 ENCOUNTER — Emergency Department (HOSPITAL_COMMUNITY)
Admission: EM | Admit: 2020-12-19 | Discharge: 2020-12-19 | Disposition: A | Discharge status: Home / Self Care | Payer: Medicare Other | Attending: Emergency Medicine | Admitting: Emergency Medicine

## 2020-12-19 DIAGNOSIS — M79604 Pain in right leg: Secondary | ICD-10-CM | POA: Diagnosis not present

## 2020-12-19 DIAGNOSIS — M79605 Pain in left leg: Secondary | ICD-10-CM | POA: Diagnosis not present

## 2020-12-19 MED ORDER — ACETAMINOPHEN 500 MG PO TABS: 500.0000 mg | ORAL_TABLET | Freq: Four times a day (QID) | ORAL | 0 refills | Status: DC | PRN

## 2020-12-19 NOTE — ED Notes (Signed)
Pt requested this RN to call her sister to pick her up. No answer at this time.

## 2020-12-19 NOTE — ED Provider Notes (Signed)
[facility]-EMERGENCY DEPT Provider Note   CSN: [id number] Arrival date & time: 12/19/20  0017     History Chief Complaint  Patient presents with   Leg Pain    [name] is a 70 y.o. female.  The history is provided by the patient and medical records. No language interpreter was used.  Leg Pain Associated symptoms: no fever     70 year old female with significant history of schizophrenia, depression, and homelessness, brought here via EMS when she was found sleeping in the bathroom at [facility].  Patient did complain of bilateral foot pain on initial contact.  Patient states that she has had persistent pain to both feet in which she described as a cramping 04/10/2008 pain worse with ambulation.  States that she is on her feet most of the day and it does not help her feet.  Has history of sciatica but this does not feel similar.  She denies having fever or chills denies numbness tingling sensation no chest pain shortness of breath.  She denies any specific treatment tried.  She denies any injury.  She is currently staying at a motel.   Past Medical History:  Diagnosis Date   Depression    Schizophrenia [facility])     Patient Active Problem List   Diagnosis Date Noted   Involuntary commitment 03/28/2020   Problem related to psychosocial circumstances 03/28/2020   Bacterial vaginal infection 08/27/2012   SCHIZOPHRENIA, PARANOID, CHRONIC 06/19/2006   WEIGHT LOSS 06/19/2006    Past Surgical History:  Procedure Laterality Date   SPINE SURGERY       OB History   No obstetric history on file.     Family History  Problem Relation Age of Onset   Heart disease Mother    Breast cancer Neg Hx     Social History   Tobacco Use   Smoking status: Never   Smokeless tobacco: Never  Vaping Use   Vaping Use: Never used  Substance Use Topics   Alcohol use: No   Drug use: No    Home Medications Prior to Admission medications   Medication Sig Start Date  End Date Taking? Authorizing Provider  naproxen (NAPROSYN) 375 MG tablet Take 1 tablet (375 mg total) by mouth 2 (two) times daily. 10/30/20   Dione Booze, MD  predniSONE (DELTASONE) 50 MG tablet Take 1 tablet (50 mg total) by mouth daily. 10/30/20   Dione Booze, MD    Allergies    Patient has no known allergies.  Review of Systems   Review of Systems  Constitutional:  Negative for fever.  Musculoskeletal:  Positive for arthralgias.  Skin:  Negative for wound.   Physical Exam Updated Vital Signs BP (!) 111/50    Pulse 79    Temp 97.8 F (36.6 C) (Oral)    Resp 18    Ht 5' (1.524 m)    Wt 29.5 kg    SpO2 100%    BMI 12.69 kg/m   Physical Exam Vitals and nursing note reviewed.  Constitutional:      General: She is not in acute distress.    Appearance: She is well-developed.     Comments: Patient sleeping soundly in bed, easily arousable and appears to be in no acute discomfort  HENT:     Head: Atraumatic.  Eyes:     Conjunctiva/sclera: Conjunctivae normal.  Pulmonary:     Effort: Pulmonary effort is normal.  Musculoskeletal:     Cervical back: Neck supple.  Comments: Bilateral lower extremities: Excoriation marks noted to bilateral legs without signs of infection.  Mild tenderness to palpation of bilateral feet without any erythema or warmth appreciated.  Intact dorsalis pedis pulse, evidence of onychomycosis involving most of the toenails.  No deformities.  Skin:    Findings: No rash.  Neurological:     Mental Status: She is alert.  Psychiatric:        Mood and Affect: Mood normal.    ED Results / Procedures / Treatments   Labs (all labs ordered are listed, but only abnormal results are displayed) Labs Reviewed - No data to display  EKG None  Radiology No results found.  Procedures Procedures   Medications Ordered in ED Medications - No data to display  ED Course  I have reviewed the triage vital signs and the nursing notes.  Pertinent labs & imaging  results that were available during my care of the patient were reviewed by me and considered in my medical decision making (see chart for details).    MDM Rules/Calculators/A&P                           BP (!) 111/50    Pulse 79    Temp 97.8 F (36.6 C) (Oral)    Resp 18    Ht 5' (1.524 m)    Wt 29.5 kg    SpO2 100%    BMI 12.69 kg/m   Final Clinical Impression(s) / ED Diagnoses Final diagnoses:  Bilateral leg pain    Rx / DC Orders ED Discharge Orders     None      8:20 AM Patient endorsed bilateral foot pain.  This is likely from patient having to ambulate and not able to wash her feet as regularly as she should.  No evidence of infection no signs of injury no evidence of gout and no concerning feature. Doubt bilateral DVT. Encourage patient to take Tylenol as needed for aches and pain, to stay off of feet when possible and I will also provide list of resources as well.  I gave patient new socks.   Fayrene Helper, PA-C 12/19/20 0825    Margarita Grizzle, MD 12/19/20 1501

## 2020-12-19 NOTE — ED Triage Notes (Signed)
Pt BIB EMS. Pt was found in the [facility] bathroom asleep. Pt complains of bilateral leg pain.

## 2020-12-19 NOTE — ED Notes (Signed)
Pt had 3-4 pairs of tight jeans on and soiled with BM and urine. Assisted pt in taking pants off and clean brief placed. Pt refused to change her shirt. Jeans placed in plastic bag. Pt give warm blanket. Blisters on bilateral heels.

## 2020-12-19 NOTE — ED Notes (Signed)
Assisted patient into [name]. Pt discharged and pt verbalized understanding.

## 2020-12-19 NOTE — ED Notes (Signed)
Spoke with Child psychotherapist. Social worker states she has been in touch with family members in the past and no one willing to help pt. Per social worker ok to discharge pt to the lobby and she will come see her in lobby. SW will try to get patient into a shelter but will be a while.   Pt was able to stand and walk a 3-4 feet with no assistance.

## 2020-12-19 NOTE — ED Notes (Addendum)
Spoke to Amma in patients contact. Amma explained pt use to be in her assisted living but is now homeless. Amma is willing to take pt back to her assisted living if pt has covid testing, STD testing, and current FL2.  Amma has a list of patients psych meds she has been on for 20 years if needed.   PA made aware. Will order social worker consult.   Per Child psychotherapist. Pt will need to follow up with her primary care for covid testing, std testing, and FL2.

## 2020-12-19 NOTE — Progress Notes (Signed)
Transition of Care [facility]) - Emergency Department Mini Assessment   Patient Details  Name: [name] MRN: [id number] Date of Birth: [birth year]  [facility]) CM/SW Contact:    Larrie Kass, LCSW Phone Number: 12/19/2020, 1:26 PM   Clinical Narrative:  CSW received call from RN requesting SW assitinace with DME. Pt refused walker. Pt stated she is trying to get in touch with her mother and brother. CSW informed pt of IRC. Pt stated she was living in motel prior to her visit to ED. Per pts chart pt was found sleeping on floor. Pt stated"im cold out side"  [facility]. They reported having space but will need to speak with pt. CSW informed pt of this. Pt reported shelter only have top bunk pt is unable to climb bunk.  Pt requesting transportation to [facility]. CSW to arrange transport.   ED Mini Assessment:    Barriers to Discharge: No Barriers Identified        Interventions which prevented an admission or readmission: Transportation Screening, Homeless Screening    Patient Contact and Communications        ,                 Admission diagnosis:  Leg Pain  Patient Active Problem List   Diagnosis Date Noted   Involuntary commitment 03/28/2020   Problem related to psychosocial circumstances 03/28/2020   Bacterial vaginal infection 08/27/2012   SCHIZOPHRENIA, PARANOID, CHRONIC 06/19/2006   WEIGHT LOSS 06/19/2006   PCP:  Dayna Barker (Inactive) Pharmacy:   [facility] [place], [place] - [address] [facility] [address] [place] [address] Phone: [phone] Fax: [phone]

## 2020-12-19 NOTE — Discharge Instructions (Signed)
Please rest your feet when possible, take Tylenol as needed for aches and pain.  Wash your feet regularly to decrease risk of infection and for better skin health.  Use resource below to seek for social services as needed

## 2020-12-20 ENCOUNTER — Telehealth (HOSPITAL_COMMUNITY): Payer: Self-pay | Admitting: Emergency Medicine

## 2020-12-20 ENCOUNTER — Encounter (HOSPITAL_COMMUNITY): Payer: Self-pay | Admitting: Pharmacy Technician

## 2020-12-20 ENCOUNTER — Emergency Department (HOSPITAL_COMMUNITY)
Admission: EM | Admit: 2020-12-20 | Discharge: 2020-12-24 | Disposition: A | Discharge status: Home / Self Care | Payer: Medicare Other | Attending: Emergency Medicine | Admitting: Emergency Medicine

## 2020-12-20 ENCOUNTER — Emergency Department (HOSPITAL_COMMUNITY): Payer: Medicare Other

## 2020-12-20 ENCOUNTER — Other Ambulatory Visit: Payer: Self-pay

## 2020-12-20 DIAGNOSIS — M7989 Other specified soft tissue disorders: Secondary | ICD-10-CM | POA: Diagnosis not present

## 2020-12-20 DIAGNOSIS — Z114 Encounter for screening for human immunodeficiency virus [HIV]: Secondary | ICD-10-CM | POA: Insufficient documentation

## 2020-12-20 DIAGNOSIS — Z7289 Other problems related to lifestyle: Secondary | ICD-10-CM | POA: Diagnosis not present

## 2020-12-20 DIAGNOSIS — Z20822 Contact with and (suspected) exposure to covid-19: Secondary | ICD-10-CM | POA: Insufficient documentation

## 2020-12-20 DIAGNOSIS — Z91040 Latex allergy status: Secondary | ICD-10-CM | POA: Diagnosis not present

## 2020-12-20 DIAGNOSIS — Z0189 Encounter for other specified special examinations: Secondary | ICD-10-CM | POA: Diagnosis present

## 2020-12-20 DIAGNOSIS — Z131 Encounter for screening for diabetes mellitus: Secondary | ICD-10-CM | POA: Insufficient documentation

## 2020-12-20 DIAGNOSIS — Z5901 Sheltered homelessness: Secondary | ICD-10-CM | POA: Insufficient documentation

## 2020-12-20 DIAGNOSIS — Z111 Encounter for screening for respiratory tuberculosis: Secondary | ICD-10-CM | POA: Insufficient documentation

## 2020-12-20 DIAGNOSIS — R5383 Other fatigue: Secondary | ICD-10-CM

## 2020-12-20 LAB — RESP PANEL BY RT-PCR (FLU A&B, COVID) ARPGX2
Influenza A by PCR: NEGATIVE
Influenza B by PCR: NEGATIVE
SARS Coronavirus 2 by RT PCR: NEGATIVE

## 2020-12-20 LAB — HIV ANTIBODY (ROUTINE TESTING W REFLEX): HIV Screen 4th Generation wRfx: NONREACTIVE

## 2020-12-20 NOTE — Progress Notes (Signed)
TOC Team spoke with Alphonzo Dublin, group home owner via phone @ [phone]. Per Ms. Allen, Pt formerly resided in Ms. Allen's care home until two years ago when she left of her own accord. Ms. Zenia Resides states that she is willing to accept Pt back in care home if she has TB test, Covid test, STD test and FL2 completed.  CSW met with Pt who states she is willing to undergo these tests and wishes to return to Ms. Allen's home. All information will be sent to Ms. Allen tomorrow 12/16 and d/c plans with be coordinated.  First shift CSW updated.

## 2020-12-20 NOTE — ED Triage Notes (Signed)
Pt here with bil lower extremity edema after eating cookies yesterday. Pt in NAD.

## 2020-12-20 NOTE — NC FL2 (Signed)
°  [place] MEDICAID FL2 LEVEL OF CARE SCREENING TOOL     IDENTIFICATION  Patient Name: LENKA ZHAO Birthdate: 1950/02/25 Sex: female Admission Date (Current Location): 12/20/2020  [facility] and [place] Number:  Producer, television/film/video and Address:  [facility] [place]. [facility], [address] [place], [place] [address]      Provider Number: [id number]  Attending Physician Name and Address:  Pollyann Savoy, MD  Relative Name and Phone Number:  Lonna Cobb [phone]    Current Level of Care: Hospital Recommended Level of Care: Other (Comment), Family Care Home (Group Home) Prior Approval Number:    Date Approved/Denied:   PASRR Number:    Discharge Plan: Other (Comment) (Group home/family care home)    Current Diagnoses: Patient Active Problem List   Diagnosis Date Noted   Involuntary commitment 03/28/2020   Problem related to psychosocial circumstances 03/28/2020   Bacterial vaginal infection 08/27/2012   SCHIZOPHRENIA, PARANOID, CHRONIC 06/19/2006   WEIGHT LOSS 06/19/2006    Orientation RESPIRATION BLADDER Height & Weight     Self, Time, Place  Normal Continent Weight: 27 lb 15.6 oz (12.7 kg) Height:  5' (152.4 cm)  BEHAVIORAL SYMPTOMS/MOOD NEUROLOGICAL BOWEL NUTRITION STATUS      Continent Diet (regular)  AMBULATORY STATUS COMMUNICATION OF NEEDS Skin   Extensive Assist (Pt is currently using a wheelchair) Verbally Normal                       Personal Care Assistance Level of Assistance  Bathing, Feeding, Dressing Bathing Assistance: Limited assistance Feeding assistance: Independent Dressing Assistance: Independent     Functional Limitations Info  Sight, Hearing, Speech Sight Info: Adequate Hearing Info: Adequate Speech Info: Adequate    SPECIAL CARE FACTORS FREQUENCY                       Contractures Contractures Info: Not present    Additional Factors Info  Code Status Code Status Info: Full              Current Medications (12/20/2020):  This is the current hospital active medication list No current facility-administered medications for this encounter.   Current Outpatient Medications  Medication Sig Dispense Refill   acetaminophen (TYLENOL) 500 MG tablet Take 1 tablet (500 mg total) by mouth every 6 (six) hours as needed. 30 tablet 0   naproxen (NAPROSYN) 375 MG tablet Take 1 tablet (375 mg total) by mouth 2 (two) times daily. 30 tablet 0   predniSONE (DELTASONE) 50 MG tablet Take 1 tablet (50 mg total) by mouth daily. 5 tablet 0     Discharge Medications: Please see discharge summary for a list of discharge medications.  Relevant Imaging Results:  Relevant Lab Results:   Additional Information SSN# [id number]  Shella Spearing, LCSW

## 2020-12-20 NOTE — ED Provider Notes (Signed)
[facility] EMERGENCY DEPARTMENT Provider Note   CSN: [id number] Arrival date & time: 12/20/20  1551     History Chief Complaint  Patient presents with   Leg Swelling    [name] is a 70 y.o. female.  HPI This is a 70 year old female with a history of schizophrenia who is presenting for medical evaluation and screening.  She is currently homeless but in the process of being placed to a group home.  The group home requested that she be screened for TB, STDs, HIV, COVID and flu prior to excepting her.  The patient has no current complaints.  She is tolerating p.o.  She denies recent fevers, chills, night sweats.  She has no current complaints of cough, congestion, runny nose, chest pain, dyspnea.    Past Medical History:  Diagnosis Date   Depression    Schizophrenia [facility])     Patient Active Problem List   Diagnosis Date Noted   Involuntary commitment 03/28/2020   Problem related to psychosocial circumstances 03/28/2020   Bacterial vaginal infection 08/27/2012   SCHIZOPHRENIA, PARANOID, CHRONIC 06/19/2006   WEIGHT LOSS 06/19/2006    Past Surgical History:  Procedure Laterality Date   SPINE SURGERY       OB History   No obstetric history on file.     Family History  Problem Relation Age of Onset   Heart disease Mother    Breast cancer Neg Hx     Social History   Tobacco Use   Smoking status: Never   Smokeless tobacco: Never  Vaping Use   Vaping Use: Never used  Substance Use Topics   Alcohol use: No   Drug use: No    Home Medications Prior to Admission medications   Medication Sig Start Date End Date Taking? Authorizing Provider  acetaminophen (TYLENOL) 500 MG tablet Take 1 tablet (500 mg total) by mouth every 6 (six) hours as needed. Patient not taking: Reported on 12/20/2020 12/19/20   Domenic Moras, PA-C  predniSONE (DELTASONE) 50 MG tablet Take 1 tablet (50 mg total) by mouth daily. Patient not taking: Reported on  [id number]   Delora Fuel, MD    Allergies    Latex and Pravastatin  Review of Systems   Review of Systems  Constitutional:  Negative for chills and fever.  HENT:  Negative for ear pain and sore throat.   Eyes:  Negative for pain and visual disturbance.  Respiratory:  Negative for cough and shortness of breath.   Cardiovascular:  Negative for chest pain and palpitations.  Gastrointestinal:  Negative for abdominal pain and vomiting.  Genitourinary:  Negative for dysuria and hematuria.  Musculoskeletal:  Negative for arthralgias and back pain.  Skin:  Negative for color change and rash.  Neurological:  Negative for seizures and syncope.  All other systems reviewed and are negative.  Physical Exam Updated Vital Signs BP (!) 103/57    Pulse 97    Temp (!) 97.1 F (36.2 C)    Resp 16    Ht 5' (1.524 m)    Wt 12.7 kg    SpO2 98%    BMI 5.46 kg/m   Physical Exam Vitals and nursing note reviewed.  Constitutional:      General: She is not in acute distress.    Appearance: Normal appearance. She is well-developed.  HENT:     Head: Normocephalic and atraumatic.     Right Ear: External ear normal.     Left Ear: External ear  normal.     Nose: Nose normal. No congestion or rhinorrhea.     Mouth/Throat:     Mouth: Mucous membranes are moist.  Eyes:     Extraocular Movements: Extraocular movements intact.     Conjunctiva/sclera: Conjunctivae normal.     Pupils: Pupils are equal, round, and reactive to light.  Cardiovascular:     Rate and Rhythm: Normal rate and regular rhythm.     Pulses: Normal pulses.     Heart sounds: No murmur heard. Pulmonary:     Effort: Pulmonary effort is normal. No respiratory distress.     Breath sounds: Normal breath sounds. No wheezing, rhonchi or rales.  Abdominal:     General: Abdomen is flat. Bowel sounds are normal.     Palpations: Abdomen is soft.     Tenderness: There is no abdominal tenderness. There is no guarding or rebound.   Musculoskeletal:        General: No swelling, tenderness or deformity.     Cervical back: Normal range of motion and neck supple. No rigidity.  Skin:    General: Skin is warm and dry.     Capillary Refill: Capillary refill takes less than 2 seconds.  Neurological:     General: No focal deficit present.     Mental Status: She is alert and oriented to person, place, and time.  Psychiatric:        Mood and Affect: Mood normal.    ED Results / Procedures / Treatments   Labs (all labs ordered are listed, but only abnormal results are displayed) Labs Reviewed  RESP PANEL BY RT-PCR (FLU A&B, COVID) ARPGX2  HIV ANTIBODY (ROUTINE TESTING W REFLEX)  GC/CHLAMYDIA PROBE AMP () NOT AT [facility]    EKG None  Radiology DG Chest 1 View  Result Date: 12/20/2020 CLINICAL DATA:  Screening for TB EXAM: CHEST  1 VIEW COMPARISON:  04/23/2004 FINDINGS: No focal opacity, pleural effusion or pneumothorax. Mild cardiomegaly. Posterior spinal rods incompletely visualized. IMPRESSION: 1. No active disease. No radiographic findings concerning for active TB. 2. Mild cardiomegaly Electronically Signed   By: Jasmine Pang M.D.   On: 12/20/2020 19:06    Procedures Procedures   Medications Ordered in ED Medications - No data to display  ED Course  I have reviewed the triage vital signs and the nursing notes.  Pertinent labs & imaging results that were available during my care of the patient were reviewed by me and considered in my medical decision making (see chart for details).    MDM Rules/Calculators/A&P                          69 year old female here for medical screening in order to be accepted to a group facility.  She is afebrile and hemodynamically stable.  Has no current complaints.  COVID and flu negative.  HIV nonreactive.  Chest x-ray showed no signs of active TB. Gc/Chla obtained. Results pending.  Discussed with care coordination team.  Will arrange transport to group facility when  formally accepted.  No further work-up indicated at this time.  Final Clinical Impression(s) / ED Diagnoses Final diagnoses:  Screening-pulmonary TB    Rx / DC Orders ED Discharge Orders     None        Lutricia Feil, MD 12/20/20 0086    Pollyann Savoy, MD 12/21/20 1003

## 2020-12-20 NOTE — Social Work (Signed)
Please contact CSW when PT is in triage.

## 2020-12-20 NOTE — Telephone Encounter (Signed)
Was asked by case management to order wheelchair for this patient who needs assistance with ambulation and cannot tolerate other devices per her report.  We will order wheelchair per recommendations.

## 2020-12-21 DIAGNOSIS — Z0189 Encounter for other specified special examinations: Secondary | ICD-10-CM | POA: Diagnosis not present

## 2020-12-21 LAB — GC/CHLAMYDIA PROBE AMP (~~LOC~~) NOT AT ARMC
Chlamydia: NEGATIVE
Comment: NEGATIVE
Comment: NORMAL
Neisseria Gonorrhea: NEGATIVE

## 2020-12-21 LAB — HEPATITIS PANEL, ACUTE
HCV Ab: NONREACTIVE
Hep A IgM: NONREACTIVE
Hep B C IgM: NONREACTIVE
Hepatitis B Surface Ag: NONREACTIVE

## 2020-12-21 NOTE — ED Provider Notes (Signed)
Discussed with CSW. Pt needs HIV, RPR, Gc/Cl, Hepatits, and HSV testing before being accepted to the facility.   Pt self swabbed for gc/cl last night, pending results. HIV negative. Will place order for remaining tests.   Additionally, requesting 30 day rx of pt's home meds, prozac 20 mg qd and zyprexa 7.5 mg qd.    Alveria Apley, PA-C 12/21/20 1356    Wynetta Fines, MD 12/21/20 1527

## 2020-12-21 NOTE — ED Notes (Signed)
Patient resting in stretcher comfortably. Eyes closed, Equal chest rise and fall. Patient alert to verbal stimuli. Call bell in reach, Stretcher in low and locked position. Side rails up x2.   

## 2020-12-21 NOTE — Progress Notes (Signed)
CSW received a call back from patients sister Rojelio Brenner who resides in [place]. Ms. Tristan Schroeder stated her sister is unable stable and doesn't take her medication. Ms. Tristan Schroeder stated that the family has helped before but patient doesn't follow through or she will sell her belongings. Ms. Tristan Schroeder stated her previous group home stated they would take her back. Ms. Tristan Schroeder stated she hopes everything works out for her sister.

## 2020-12-21 NOTE — Progress Notes (Signed)
CSW contacted Amma Freida Busman with [facility] and updated her on the current results. Ms. Freida Busman asked if those results can be sent to her and the [facility] for review. Ms. Freida Busman also mentioned patient being on Zyprexa 7.5 mg at bedtime and Prozac every morning.     [email or web address] (E-mail address)

## 2020-12-21 NOTE — Progress Notes (Signed)
CSW met with Pt at bedside and was able to ascertain where her social security payment is and the location of her card. Updated group home.

## 2020-12-22 LAB — RPR: RPR Ser Ql: NONREACTIVE

## 2020-12-22 NOTE — ED Notes (Signed)
This RN spoke with Dr. Pilar Plate in reference to a note in pt's chart, which states that (per staff from pt's group home) pt takes AM  And PM medications. This RN requested that Dr. Pilar Plate investigate/order home medications in social worker's note.

## 2020-12-22 NOTE — ED Provider Notes (Signed)
Patient here in department with reported history of schizophrenia to be placed in group home.  Here she is hemodynamically stable and appears medically stable.  Reading prior notes, she was referred to group home require HIV RPR GC chlamydia hepatitis and HSV testing before excepting to facility. HIV negative GC chlamydia negative Hepatitis negative HSV and RPR pending    Margarita Grizzle, MD 12/22/20 [phone]

## 2020-12-23 NOTE — ED Notes (Signed)
Pt was seen by staff to slide from the edge of the bed onto the floor, did not hit her head. EDP Ray was messaged about the event. Pt not c/o any pain.

## 2020-12-23 NOTE — ED Notes (Signed)
Breakfast orders placed 

## 2020-12-24 MED ORDER — FLUOXETINE HCL 20 MG PO TABS: 20.0000 mg | ORAL_TABLET | Freq: Every day | ORAL | 0 refills | Status: AC

## 2020-12-24 MED ORDER — FLUOXETINE HCL 20 MG PO CAPS
20.0000 mg | ORAL_CAPSULE | Freq: Every day | ORAL | Status: DC
  Filled 2020-12-24: qty 1

## 2020-12-24 MED ORDER — ACETAMINOPHEN 500 MG PO TABS: 500.0000 mg | ORAL_TABLET | Freq: Four times a day (QID) | ORAL | 0 refills | Status: AC | PRN

## 2020-12-24 MED ORDER — OLANZAPINE 7.5 MG PO TABS: 7.5000 mg | ORAL_TABLET | Freq: Every day | ORAL | 0 refills | Status: AC

## 2020-12-24 MED ORDER — OLANZAPINE 5 MG PO TABS
7.5000 mg | ORAL_TABLET | Freq: Every day | ORAL | Status: DC
  Filled 2020-12-24: qty 1

## 2020-12-24 NOTE — NC FL2 (Signed)
°  [place] MEDICAID FL2 LEVEL OF CARE SCREENING TOOL     IDENTIFICATION  Patient Name: [name] Birthdate: February 10, 1950 Sex: female Admission Date (Current Location): 12/20/2020  [facility] and [place] Number:  Producer, television/film/video and Address:  [facility] [place]. [facility], [address], [place], [place] [address]      Provider Number: [id number]  Attending Physician Name and Address:  Default, Provider, MD  Relative Name and Phone Number:  Lonna Cobb [phone]    Current Level of Care: Hospital Recommended Level of Care: Other (Comment), Family Care Home [facility]) Prior Approval Number:    Date Approved/Denied:   PASRR Number:    Discharge Plan: Other (Comment) (Group Home)    Current Diagnoses: Patient Active Problem List   Diagnosis Date Noted   Involuntary commitment 03/28/2020   Problem related to psychosocial circumstances 03/28/2020   Bacterial vaginal infection 08/27/2012   SCHIZOPHRENIA, PARANOID, CHRONIC 06/19/2006   WEIGHT LOSS 06/19/2006    Orientation RESPIRATION BLADDER Height & Weight     Self, Time, Place  Normal Continent Weight: 70 lb (31.8 kg) Height:  5' (152.4 cm)  BEHAVIORAL SYMPTOMS/MOOD NEUROLOGICAL BOWEL NUTRITION STATUS      Continent Diet (Regular)  AMBULATORY STATUS COMMUNICATION OF NEEDS Skin   Extensive Assist Verbally Normal                       Personal Care Assistance Level of Assistance  Bathing, Feeding, Dressing Bathing Assistance: Limited assistance Feeding assistance: Independent Dressing Assistance: Independent     Functional Limitations Info  Sight, Hearing, Speech Sight Info: Adequate Hearing Info: Adequate Speech Info: Adequate    SPECIAL CARE FACTORS FREQUENCY                       Contractures Contractures Info: Not present    Additional Factors Info  Code Status, Allergies Code Status Info: Full Allergies Info: Latex and Pravastatin           Current  Medications (12/24/2020):  This is the current hospital active medication list Current Facility-Administered Medications  Medication Dose Route Frequency Provider Last Rate Last Admin   FLUoxetine (PROZAC) capsule 20 mg  20 mg Oral Daily Dykstra, Quitman Livings, MD       OLANZapine (ZYPREXA) tablet 7.5 mg  7.5 mg Oral QHS Milagros Loll, MD       Current Outpatient Medications  Medication Sig Dispense Refill   FLUoxetine (PROZAC) 20 MG tablet Take 1 tablet (20 mg total) by mouth daily. 30 tablet 0   OLANZapine (ZYPREXA) 7.5 MG tablet Take 1 tablet (7.5 mg total) by mouth at bedtime. 30 tablet 0   acetaminophen (TYLENOL) 500 MG tablet Take 1 tablet (500 mg total) by mouth every 6 (six) hours as needed. (Patient not taking: Reported on 12/20/2020) 30 tablet 0   predniSONE (DELTASONE) 50 MG tablet Take 1 tablet (50 mg total) by mouth daily. (Patient not taking: Reported on 12/20/2020) 5 tablet 0     Discharge Medications: Please see discharge summary for a list of discharge medications.  Relevant Imaging Results:  Relevant Lab Results:   Additional Information SSN# [id number]  Susa Simmonds, LCSWA

## 2020-12-24 NOTE — NC FL2 (Signed)
°  [place] MEDICAID FL2 LEVEL OF CARE SCREENING TOOL     IDENTIFICATION  Patient Name: [name] Birthdate: December 04, 1950 Sex: female Admission Date (Current Location): 12/20/2020  [facility] and [place] Number:  Producer, television/film/video and Address:  [facility] [place]. [facility], [address], [place], [place] [address]      Provider Number: [id number]  Attending Physician Name and Address:  Default, Provider, MD  Relative Name and Phone Number:  Lonna Cobb [phone]    Current Level of Care: Hospital Recommended Level of Care: Other (Comment), Family Care Home (Group Home) Prior Approval Number:    Date Approved/Denied:   PASRR Number:    Discharge Plan: Other (Comment) (Group Home/Family Care Home)    Current Diagnoses: Patient Active Problem List   Diagnosis Date Noted   Involuntary commitment 03/28/2020   Problem related to psychosocial circumstances 03/28/2020   Bacterial vaginal infection 08/27/2012   SCHIZOPHRENIA, PARANOID, CHRONIC 06/19/2006   WEIGHT LOSS 06/19/2006    Orientation RESPIRATION BLADDER Height & Weight     Self, Time, Place  Normal Continent Weight: 70 lb (31.8 kg) Height:  5' (152.4 cm)  BEHAVIORAL SYMPTOMS/MOOD NEUROLOGICAL BOWEL NUTRITION STATUS      Continent Diet (Regular)  AMBULATORY STATUS COMMUNICATION OF NEEDS Skin   Extensive Assist Verbally Normal                       Personal Care Assistance Level of Assistance  Bathing, Feeding, Dressing Bathing Assistance: Limited assistance Feeding assistance: Independent Dressing Assistance: Independent     Functional Limitations Info  Sight, Hearing, Speech Sight Info: Adequate Hearing Info: Adequate Speech Info: Adequate    SPECIAL CARE FACTORS FREQUENCY                       Contractures Contractures Info: Not present    Additional Factors Info  Code Status, Allergies Code Status Info: Full Allergies Info: Latex and Pravastatin            Current Medications (12/24/2020):  This is the current hospital active medication list No current facility-administered medications for this encounter.   Current Outpatient Medications  Medication Sig Dispense Refill   acetaminophen (TYLENOL) 500 MG tablet Take 1 tablet (500 mg total) by mouth every 6 (six) hours as needed. (Patient not taking: Reported on 12/20/2020) 30 tablet 0   predniSONE (DELTASONE) 50 MG tablet Take 1 tablet (50 mg total) by mouth daily. (Patient not taking: Reported on 12/20/2020) 5 tablet 0     Discharge Medications: Please see discharge summary for a list of discharge medications.  Relevant Imaging Results:  Relevant Lab Results:   Additional Information SSN# [id number]  Susa Simmonds, LCSWA

## 2020-12-24 NOTE — ED Provider Notes (Signed)
Emergency Medicine Observation Re-evaluation Note  [name] is a 70 y.o. female, seen on rounds today.  Pt initially presented to the ED for complaints of Leg Swelling Currently, the patient is resting.  Physical Exam  BP (!) 109/58    Pulse 80    Temp 98.6 F (37 C) (Oral)    Resp 16    Ht 5' (1.524 m)    Wt 31.8 kg    SpO2 97%    BMI 13.67 kg/m  Physical Exam General: calm3 Cardiac: warm and well perfused Lungs: even unlabored Psych: calm  ED Course / MDM  EKG:   I have reviewed the labs performed to date as well as medications administered while in observation.  Recent changes in the last 24 hours include awaiting placement to group home.  Plan  Current plan is for placement in group home. Dispo per TOC.  [name] is not under involuntary commitment.     Milagros Loll, MD 12/24/20 [phone]

## 2020-12-24 NOTE — ED Notes (Signed)
Breakfast orders placed 

## 2020-12-24 NOTE — ED Notes (Signed)
Pt was escorted by staff to the transport arranged for her to get to her group home.

## 2020-12-24 NOTE — Discharge Instructions (Signed)
Follow-up with your primary care doctor to discuss her symptoms.  Come back to ER as needed for worsening symptoms.

## 2020-12-25 LAB — HSV DNA BY PCR (REFERENCE LAB)
HSV 1 DNA: NEGATIVE
HSV 2 DNA: NEGATIVE

## 2021-01-08 ENCOUNTER — Other Ambulatory Visit: Payer: Self-pay

## 2021-01-08 ENCOUNTER — Emergency Department (HOSPITAL_COMMUNITY)
Admission: EM | Admit: 2021-01-08 | Discharge: 2021-01-08 | Disposition: A | Discharge status: Home / Self Care | Payer: Medicare Other | Attending: Emergency Medicine | Admitting: Emergency Medicine

## 2021-01-08 ENCOUNTER — Encounter (HOSPITAL_COMMUNITY): Payer: Self-pay | Admitting: Emergency Medicine

## 2021-01-08 ENCOUNTER — Emergency Department (HOSPITAL_COMMUNITY): Payer: Medicare Other

## 2021-01-08 DIAGNOSIS — S022XXA Fracture of nasal bones, initial encounter for closed fracture: Secondary | ICD-10-CM | POA: Diagnosis not present

## 2021-01-08 DIAGNOSIS — W19XXXA Unspecified fall, initial encounter: Secondary | ICD-10-CM

## 2021-01-08 DIAGNOSIS — S0993XA Unspecified injury of face, initial encounter: Secondary | ICD-10-CM | POA: Diagnosis present

## 2021-01-08 DIAGNOSIS — W01198A Fall on same level from slipping, tripping and stumbling with subsequent striking against other object, initial encounter: Secondary | ICD-10-CM | POA: Insufficient documentation

## 2021-01-08 DIAGNOSIS — Z91040 Latex allergy status: Secondary | ICD-10-CM | POA: Insufficient documentation

## 2021-01-08 MED ORDER — ACETAMINOPHEN 500 MG PO TABS
1000.0000 mg | ORAL_TABLET | Freq: Once | ORAL | Status: AC
  Administered 2021-01-08: 1000 mg via ORAL
  Filled 2021-01-08: qty 2

## 2021-01-08 NOTE — ED Provider Notes (Signed)
[facility] EMERGENCY DEPARTMENT Provider Note   CSN: [id number] Arrival date & time: 01/08/21  6144     History  Chief Complaint  Patient presents with   Fall / Nasal Injury    [name] is a 71 y.o. female.  HPI 71 year old female presents today with complaints of fall.  [place] on something fell forward landing on her face.  She is complaining of pain in her nose.  States she also struck her forehead.  She does not think that she lost consciousness.  She denies being on any blood thinners.  She does not think she has other injuries.    Home Medications Prior to Admission medications   Medication Sig Start Date End Date Taking? Authorizing Provider  acetaminophen (TYLENOL) 500 MG tablet Take 1 tablet (500 mg total) by mouth every 6 (six) hours as needed. 12/24/20   Milagros Loll, MD  FLUoxetine (PROZAC) 20 MG tablet Take 1 tablet (20 mg total) by mouth daily. 12/24/20   Milagros Loll, MD  OLANZapine (ZYPREXA) 7.5 MG tablet Take 1 tablet (7.5 mg total) by mouth at bedtime. 12/24/20   Milagros Loll, MD      Allergies    Latex and Pravastatin    Review of Systems   Review of Systems  All other systems reviewed and are negative.  Physical Exam Updated Vital Signs BP 109/79 (BP Location: Right Arm)    Pulse 74    Temp (!) 97.4 F (36.3 C)    Resp 16    SpO2 100%  Physical Exam Vitals reviewed.  Constitutional:      Appearance: Normal appearance.     Comments: Cachectic female  HENT:     Head: Normocephalic.     Comments: Abrasion to bridge of nose Nares are patent No septal hematomas noted No active bleeding is noted    Mouth/Throat:     Mouth: Mucous membranes are moist.  Eyes:     Pupils: Pupils are equal, round, and reactive to light.  Cardiovascular:     Rate and Rhythm: Normal rate.  Pulmonary:     Effort: Pulmonary effort is normal.  Abdominal:     Palpations: Abdomen is  soft.  Musculoskeletal:        General: Normal range of motion.     Cervical back: Normal range of motion.  Skin:    General: Skin is warm.     Capillary Refill: Capillary refill takes less than 2 seconds.     Comments: Patient has multiple small abrasions on her legs  Neurological:     General: No focal deficit present.     Mental Status: She is alert.  Psychiatric:        Mood and Affect: Mood normal.    ED Results / Procedures / Treatments   Labs (all labs ordered are listed, but only abnormal results are displayed) Labs Reviewed - No data to display  EKG None  Radiology CT HEAD WO CONTRAST ( )  Result Date: 01/08/2021 CLINICAL DATA:  Status post fall. EXAM: CT HEAD WITHOUT CONTRAST TECHNIQUE: Contiguous axial images were obtained from the base of the skull through the vertex without intravenous contrast. COMPARISON:  None. FINDINGS: Brain: There is mild cerebral atrophy with widening of the extra-axial spaces and ventricular dilatation. There are areas of decreased attenuation within the white matter tracts of the supratentorial brain, consistent with microvascular disease changes. Vascular: No hyperdense vessel  or unexpected calcification. Skull: Bilateral nondisplaced nasal bone fractures are noted. Sinuses/Orbits: No acute finding. Other: Mild bilateral paranasal soft tissue swelling is seen. IMPRESSION: 1. Generalized cerebral atrophy. 2. Mild bilateral paranasal soft tissue swelling with bilateral nondisplaced nasal bone fractures. 3. No acute intracranial abnormality. Electronically Signed   By: Aram Candela M.D.   On: 01/08/2021 03:17   CT Cervical Spine Wo Contrast  Result Date: 01/08/2021 CLINICAL DATA:  Status post fall. EXAM: CT CERVICAL SPINE WITHOUT CONTRAST TECHNIQUE: Multidetector CT imaging of the cervical spine was performed without intravenous contrast. Multiplanar CT image reconstructions were also generated. COMPARISON:  None. FINDINGS: Alignment: There is  reversal of the normal cervical spine lordosis. Skull base and vertebrae: No acute fracture. No primary bone lesion or focal pathologic process. Soft tissues and spinal canal: No prevertebral fluid or swelling. No visible canal hematoma. Disc levels: Moderate severity endplate sclerosis is seen at the levels of C5-C6 and C6-C7, with marked severity anterior osteophyte formation seen at the levels of C2-C3, C3-C4, C4-C5, C5-C6 and C6-C7. There is moderate to marked severity intervertebral disc space narrowing at the level of C5-C6 with moderate severity intervertebral disc space narrowing noted at C6-C7. Bilateral moderate to marked severity multilevel facet joint hypertrophy is noted. Upper chest: Negative. Other: None. IMPRESSION: 1. Marked severity multilevel degenerative changes, most prominent at the levels of C5-C6 and C6-C7. 2. Reversal of the normal cervical spine lordosis, likely positional. 3. No evidence of an acute cervical spine fracture. Electronically Signed   By: Aram Candela M.D.   On: 01/08/2021 03:23   CT Maxillofacial Wo Contrast  Result Date: 01/08/2021 CLINICAL DATA:  Status post fall. EXAM: CT MAXILLOFACIAL WITHOUT CONTRAST TECHNIQUE: Multidetector CT imaging of the maxillofacial structures was performed. Multiplanar CT image reconstructions were also generated. COMPARISON:  None. FINDINGS: Osseous: Acute, bilateral mildly displaced nasal bone fractures are seen. Orbits: Negative. No traumatic or inflammatory finding. Sinuses: There is mild bilateral ethmoid sinus mucosal thickening. Soft tissues: Mild to moderate severity bilateral paranasal soft tissue swelling is seen. Limited intracranial: No significant or unexpected finding. IMPRESSION: 1. Bilateral paranasal soft tissue swelling with acute, bilateral mildly displaced nasal bone fractures. 2. Mild bilateral ethmoid sinus disease. Electronically Signed   By: Aram Candela M.D.   On: 01/08/2021 03:19    Procedures Procedures     Medications Ordered in ED Medications  acetaminophen (TYLENOL) tablet 1,000 mg (1,000 mg Oral Given 01/08/21 0257)    ED Course/ Medical Decision Making/ A&P Clinical Course as of 01/08/21 1224  Tue Jan 08, 2021  1148 Reviewed CT head, maxillofacial, and cervical spine [DR]  1151 CT cervical spine, maxillofacial, and head personally reviewed and bilateral nasal bone fractures noted Radiologist interpretation noted and concur [DR]    Clinical Course User Index [DR] Margarita Grizzle, MD                           Medical Decision Making 71 year old female with what sounds like mechanical fall presents hence head CT, facial bone CT, neck CT. CTs are significant for no acute intracranial abnormality, no acute cervical abnormality, bilateral mildly displaced nasal bone fractures   Amount and/or Complexity of Data Reviewed Radiology: ordered and independent interpretation performed.  Risk Risk Details: Patient presented today with fall.  She is cachectic and appears to have some unstable housing situations.  However it appears that she had mechanical fall and imaging was appropriately obtained.  This does reveal nasal  bone fractures but no evidence of acute intracranial abnormalities or cervical spine injury.  On her physical exam there is no appear to be any injury to her trunk or extremities.  She appears to be stable for discharge.  She has been given information regarding need for follow-up and return precautions and voices understanding.  Final Clinical Impression(s) / ED Diagnoses Final diagnoses:  Fall, initial encounter  Closed fracture of nasal bone, initial encounter    Rx / DC Orders ED Discharge Orders     None         Margarita Grizzleay, Delorice Bannister, MD 01/08/21 1224

## 2021-01-08 NOTE — ED Provider Notes (Signed)
Emergency Medicine Provider Triage Evaluation Note  [name] , a 71 y.o. female  was evaluated in triage.  Pt complains of pain to nose and neck after suffering a mechanical fall.  Patient states that she had mechanical fall after getting her foot caught in a "groove," tripping forward.  Injury occurred approximately 2300.  Patient denies any loss of consciousness.  Denies any blood thinners.  Unsure when last tetanus shot was  Review of Systems  Positive: Left shoulder pain, neck pain, swelling and pain to the nose, laceration to nose Negative: Syncope, headache, visual disturbance, numbness, weakness  Physical Exam  BP 110/78 (BP Location: Right Arm)    Pulse 91    Temp 97.9 F (36.6 C) (Oral)    Resp 16    SpO2 97%  Gen:   Awake, no distress   Resp:  Normal effort  MSK:   Moves extremities without difficulty  Other:  No midline tenderness or deformity to cervical, thoracic, or lumbar spine.  Patient has significant swelling to nose.  Laceration to bridge of nose.  Medical Decision Making  Medically screening exam initiated at 2:50 AM.  Appropriate orders placed.  [name] was informed that the remainder of the evaluation will be completed by another provider, this initial triage assessment does not replace that evaluation, and the importance of remaining in the ED until their evaluation is complete.  CT of head, cervical spine, and maxillofacial ordered.  Patient will need tetanus shot updated.   Loni Beckwith, PA-C 01/08/21 0252    Merrily Pew, MD 01/08/21 [phone]

## 2021-01-08 NOTE — Discharge Instructions (Addendum)
Your fall resulted in a broken nose.  It is slightly displaced.  There is no bleeding currently.  Please call [facility] for outpatient follow-up in 1 to 2 weeks.

## 2021-01-08 NOTE — ED Notes (Signed)
Patient discharge instructions reviewed with the patient. The patient verbalized understanding of instructions. Patient discharge with bus pass.

## 2021-01-08 NOTE — ED Triage Notes (Signed)
Patient tripped and fell this evening , presents with nasal swelling/abrasions , no LOC/ambulatory .

## 2021-01-08 NOTE — Progress Notes (Signed)
CSW contacted Ms. Freida Busman [phone] with [facility] who informed CSW that patient left the group home on 01/04/21. CSW was told that her sister and the group home tried to convince her to stay at the group home. Patient told them that she didn't want to pay the group home and wanted to be homeless. Patient told the group home that she was going to get a hotel. Ms. Freida Busman stated they work so hard to get her stay but she chose not too. Patient will be provided with a bus pass.

## 2021-02-06 ENCOUNTER — Other Ambulatory Visit: Payer: Self-pay | Admitting: Internal Medicine

## 2021-02-06 DIAGNOSIS — Z1231 Encounter for screening mammogram for malignant neoplasm of breast: Secondary | ICD-10-CM

## 2021-02-07 ENCOUNTER — Other Ambulatory Visit: Payer: Self-pay | Admitting: Internal Medicine

## 2021-02-07 DIAGNOSIS — E2839 Other primary ovarian failure: Secondary | ICD-10-CM

## 2021-02-25 ENCOUNTER — Ambulatory Visit
Admission: RE | Admit: 2021-02-25 | Discharge: 2021-02-25 | Disposition: A | Discharge status: Home / Self Care | Payer: Medicare Other | Source: Ambulatory Visit | Attending: Internal Medicine | Admitting: Internal Medicine

## 2021-02-25 DIAGNOSIS — Z1231 Encounter for screening mammogram for malignant neoplasm of breast: Secondary | ICD-10-CM

## 2021-08-19 ENCOUNTER — Other Ambulatory Visit: Payer: Medicaid Other

## 2021-08-19 ENCOUNTER — Inpatient Hospital Stay: Admission: RE | Admit: 2021-08-19 | Payer: Medicaid Other | Source: Ambulatory Visit

## 2021-12-20 ENCOUNTER — Other Ambulatory Visit: Payer: Self-pay | Admitting: Internal Medicine

## 2021-12-20 DIAGNOSIS — Z1231 Encounter for screening mammogram for malignant neoplasm of breast: Secondary | ICD-10-CM

## 2022-01-16 ENCOUNTER — Other Ambulatory Visit: Payer: Self-pay | Admitting: Internal Medicine

## 2022-01-18 LAB — COMPLETE METABOLIC PANEL WITH GFR
AG Ratio: 1.5 (calc) (ref 1.0–2.5)
ALT: 9 U/L (ref 6–29)
AST: 18 U/L (ref 10–35)
Albumin: 4.3 g/dL (ref 3.6–5.1)
Alkaline phosphatase (APISO): 83 U/L (ref 37–153)
BUN: 18 mg/dL (ref 7–25)
CO2: 25 mmol/L (ref 20–32)
Calcium: 9.3 mg/dL (ref 8.6–10.4)
Chloride: 104 mmol/L (ref 98–110)
Creat: 0.78 mg/dL (ref 0.50–1.05)
Globulin: 2.8 g/dL (calc) (ref 1.9–3.7)
Glucose, Bld: 120 mg/dL — ABNORMAL HIGH (ref 65–99)
Potassium: 3.9 mmol/L (ref 3.5–5.3)
Sodium: 140 mmol/L (ref 135–146)
Total Bilirubin: 0.3 mg/dL (ref 0.2–1.2)
Total Protein: 7.1 g/dL (ref 6.1–8.1)
eGFR: 82 mL/min/{1.73_m2} (ref >=60)

## 2022-01-18 LAB — CBC
HCT: 39.6 % (ref 35.0–45.0)
Hemoglobin: 13.2 g/dL (ref 11.7–15.5)
MCH: 29.8 pg (ref 27.0–33.0)
MCHC: 33.3 g/dL (ref 32.0–36.0)
MCV: 89.4 fL (ref 80.0–100.0)
MPV: 10.3 fL (ref 7.5–12.5)
Platelets: 274 10*3/uL (ref 140–400)
RBC: 4.43 10*6/uL (ref 3.80–5.10)
RDW: 12.9 % (ref 11.0–15.0)
WBC: 5.1 10*3/uL (ref 3.8–10.8)

## 2022-01-18 LAB — TSH: TSH: 1.75 mIU/L (ref 0.40–4.50)

## 2022-01-18 LAB — LIPID PANEL
Cholesterol: 222 mg/dL — ABNORMAL HIGH (ref <200)
HDL: 67 mg/dL (ref >=50)
LDL Cholesterol (Calc): 135 mg/dL (calc) — ABNORMAL HIGH
Non-HDL Cholesterol (Calc): 155 mg/dL (calc) — ABNORMAL HIGH (ref <130)
Total CHOL/HDL Ratio: 3.3 (calc) (ref <5.0)
Triglycerides: 101 mg/dL (ref <150)

## 2022-01-18 LAB — CLIENT EDUCATION TRACKING

## 2022-01-18 LAB — VITAMIN D 25 HYDROXY (VIT D DEFICIENCY, FRACTURES): Vit D, 25-Hydroxy: 36 ng/mL (ref 30–100)

## 2022-02-26 ENCOUNTER — Ambulatory Visit
Admission: RE | Admit: 2022-02-26 | Discharge: 2022-02-26 | Disposition: A | Discharge status: Home / Self Care | Payer: Medicare Other | Source: Ambulatory Visit | Attending: Internal Medicine | Admitting: Internal Medicine

## 2022-02-26 DIAGNOSIS — Z1231 Encounter for screening mammogram for malignant neoplasm of breast: Secondary | ICD-10-CM

## 2022-08-14 IMAGING — CT CT MAXILLOFACIAL W/O CM
3 series · 14 of 47 positions shown, 16 images · non-contrast
Comparison: None.

CLINICAL DATA: Status post fall.

EXAM:
CT MAXILLOFACIAL WITHOUT CONTRAST
TECHNIQUE: Multidetector CT imaging of the maxillofacial structures was
performed. Multiplanar CT image reconstructions were also generated.

[Series 3: facialbone 2.0 st · axial · 0.33mm/px · z∈[-256,-128]mm · 8 of 76 slices shown, 10 images]
[im 6/76  brain]
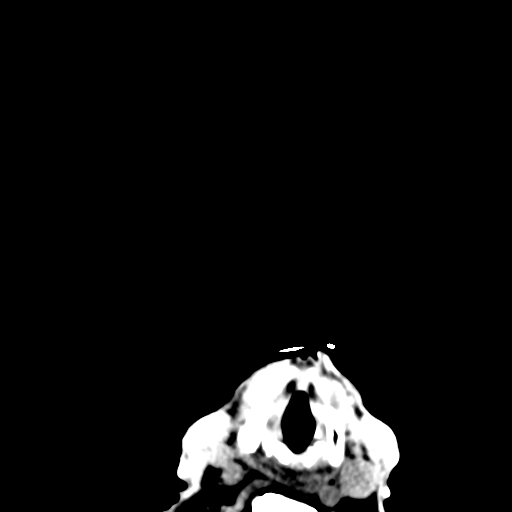
[im 6/76  bone]
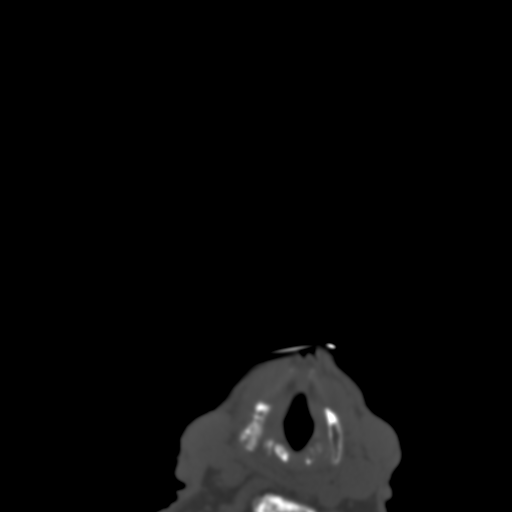
[im 16/76  bone]
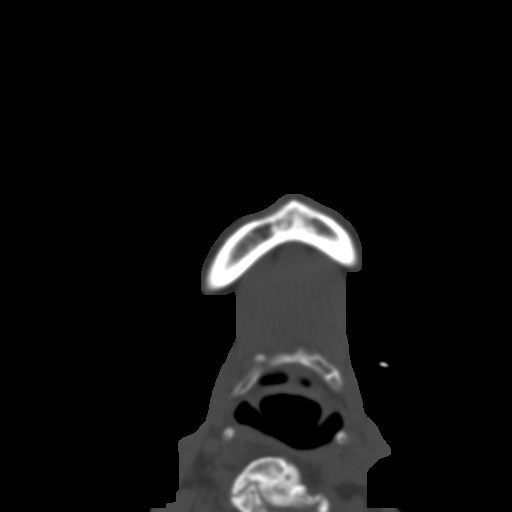
[im 24/76  bone]
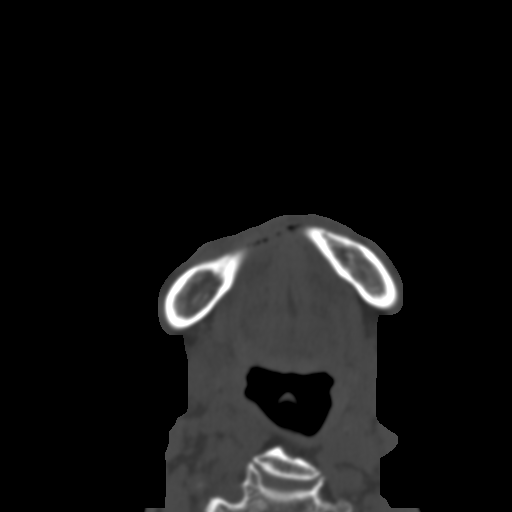
[im 34/76  bone]
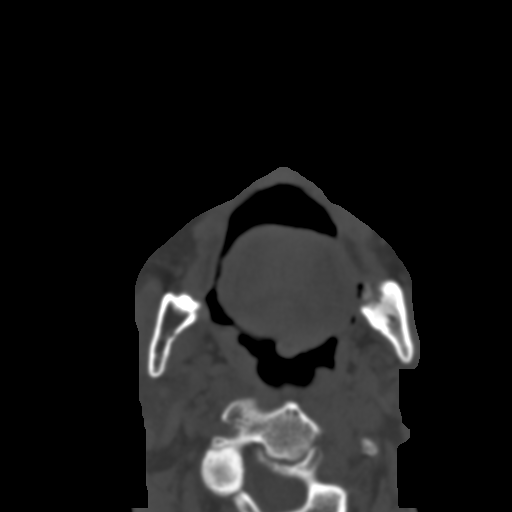
[im 42/76  brain]
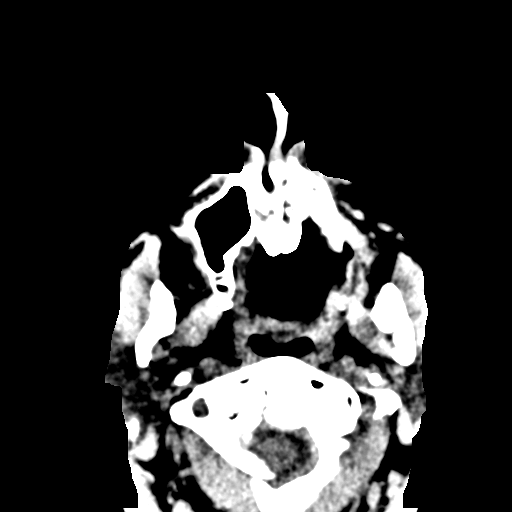
[im 42/76  bone]
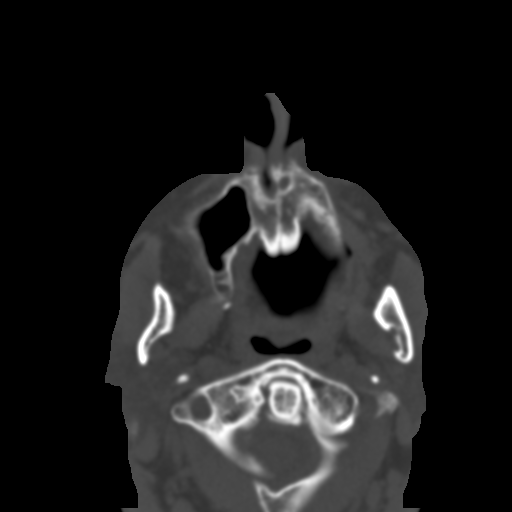
[im 52/76  bone]
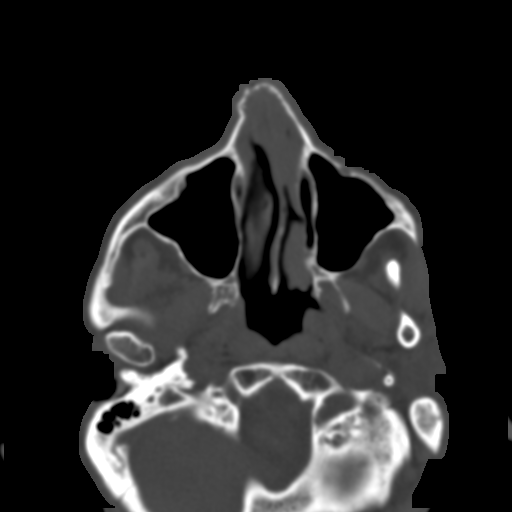
[im 60/76  bone]
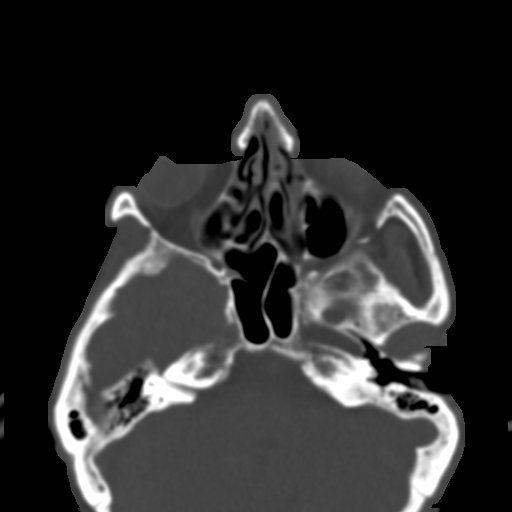
[im 70/76  bone]
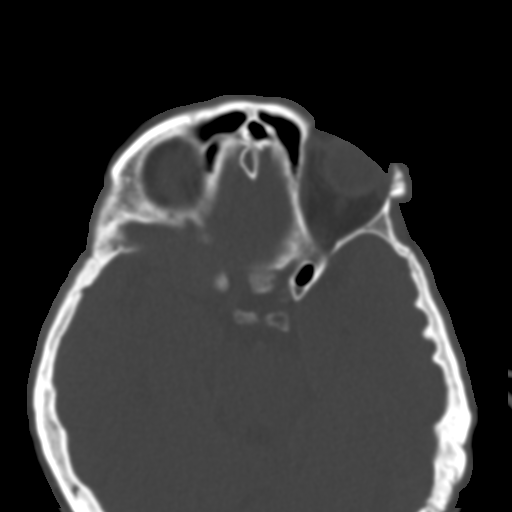

[Series 7: facialbone 2.0 cor st · coronal · 0.33mm/px · 3 of 113 slices shown]
[im 38/113  bone]
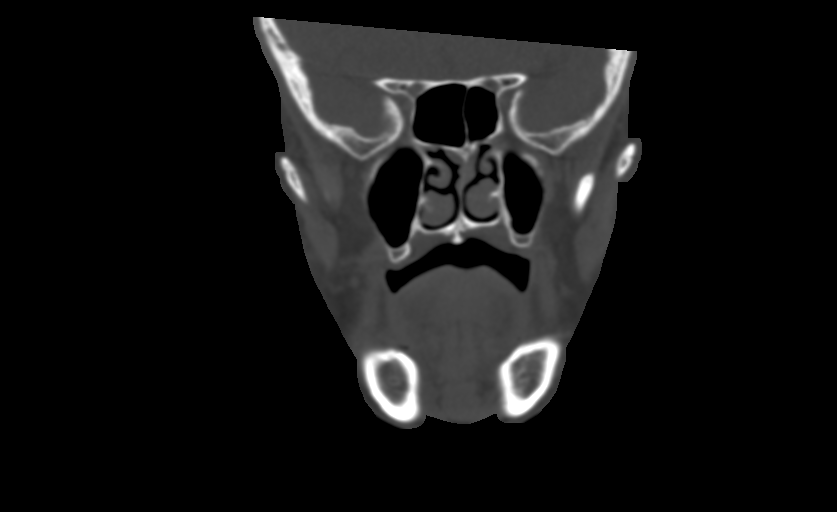
[im 50/113  bone]
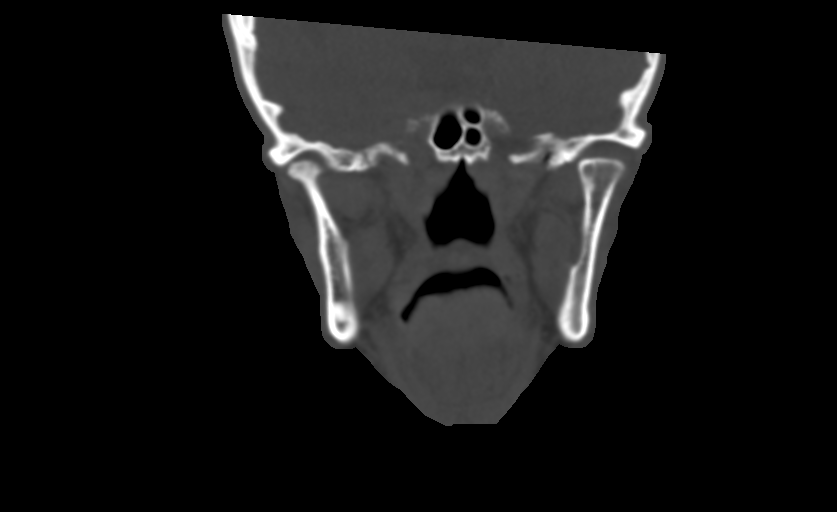
[im 63/113  bone]
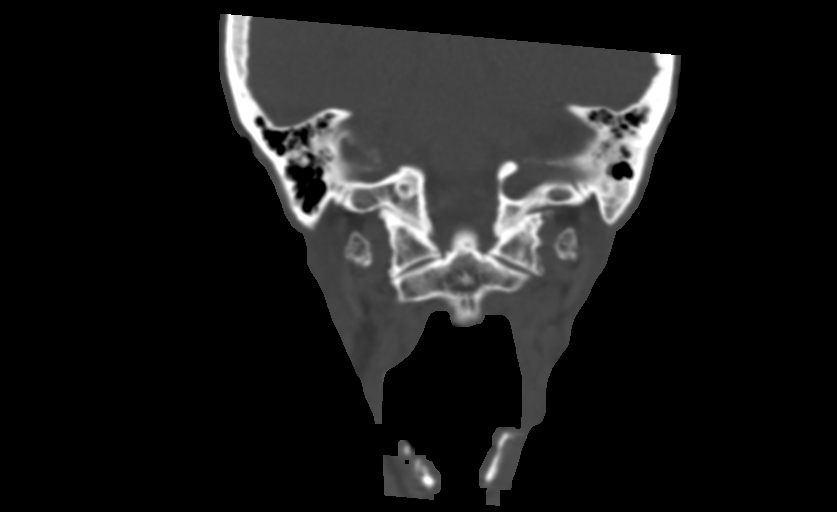

[Series 8: facialbone 2.0 sag st · sagittal · 0.30mm/px · 3 of 81 slices shown]
[im 27/81  bone]
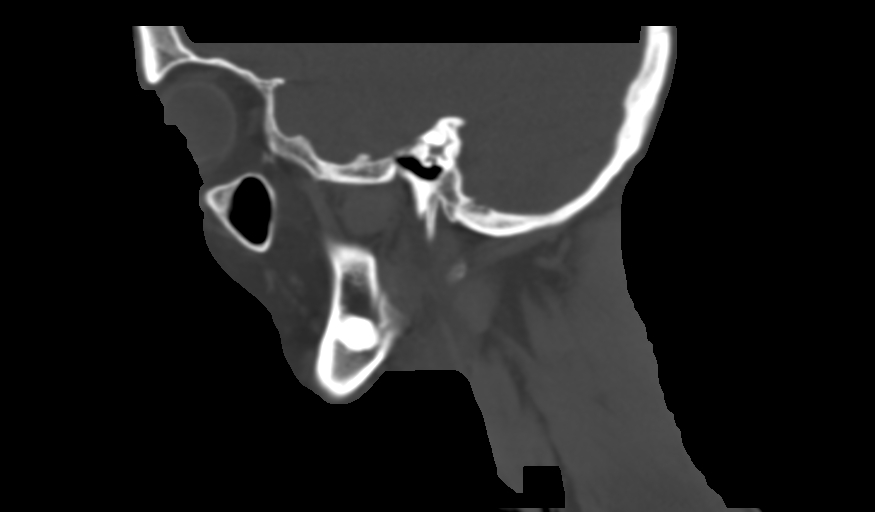
[im 41/81  bone]
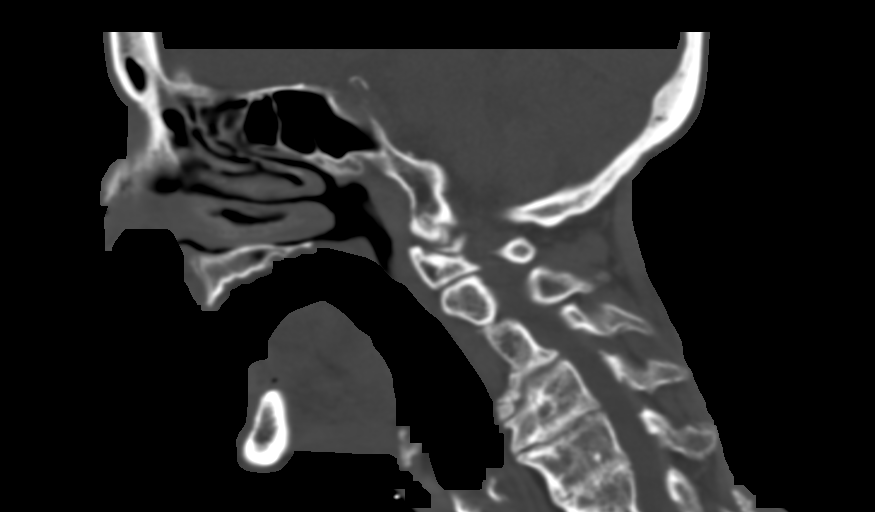
[im 54/81  bone]
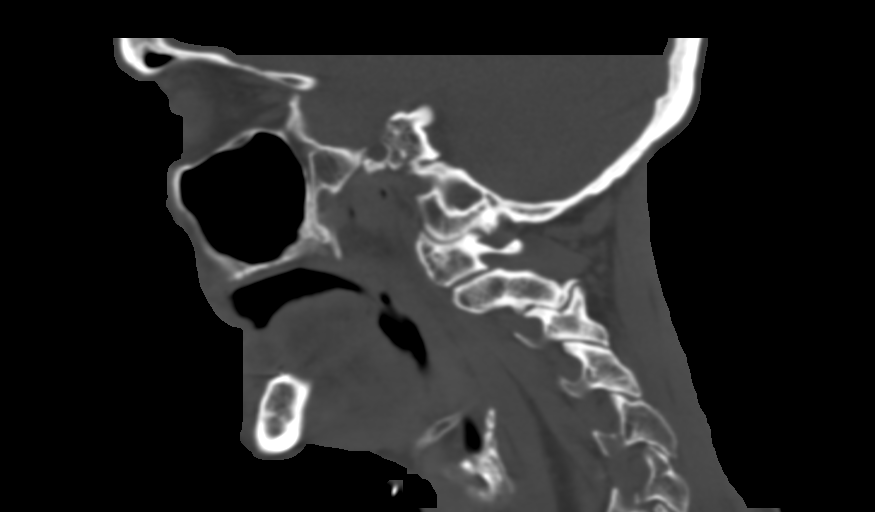

[14 of 47 positions shown; findings below may reference images not displayed]

FINDINGS: Osseous: Acute, bilateral mildly displaced nasal bone fractures are
seen.

Orbits: Negative. No traumatic or inflammatory finding.

Sinuses: There is mild bilateral ethmoid sinus mucosal thickening.

Soft tissues: Mild to moderate severity bilateral paranasal soft
tissue swelling is seen.

Limited intracranial: No significant or unexpected finding.
IMPRESSION: 1. Bilateral paranasal soft tissue swelling with acute, bilateral
mildly displaced nasal bone fractures.
2. Mild bilateral ethmoid sinus disease.

## 2022-08-14 IMAGING — CT CT HEAD W/O CM
3 of 4 series · 13 of 47 positions shown, 15 images · non-contrast
Comparison: None.

CLINICAL DATA: Status post fall.

EXAM:
CT HEAD WITHOUT CONTRAST
TECHNIQUE: Contiguous axial images were obtained from the base of the skull
through the vertex without intravenous contrast.

[Series 3: head without · axial · non-contrast · 0.45mm/px · z∈[-172,-38]mm · 7 of 37 slices shown, 9 images]
[im 5/37  brain]
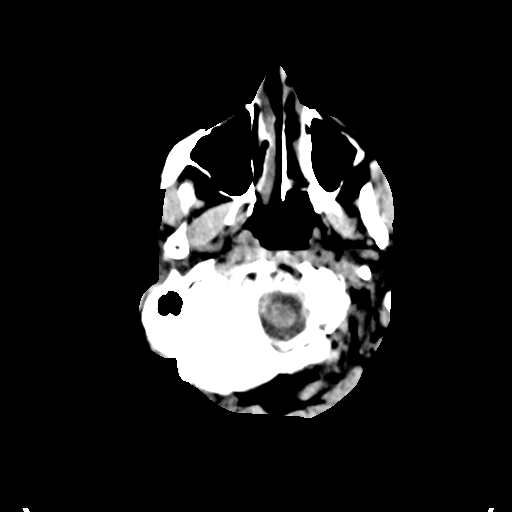
[im 5/37  bone]
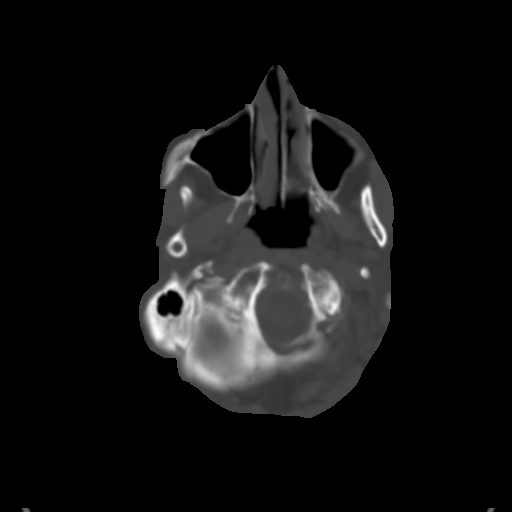
[im 10/37  brain]
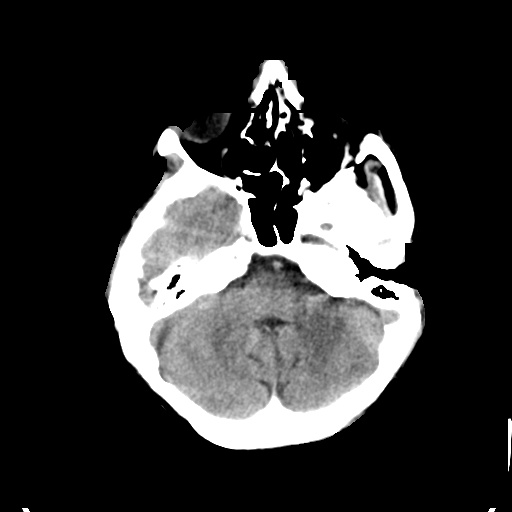
[im 14/37  brain]
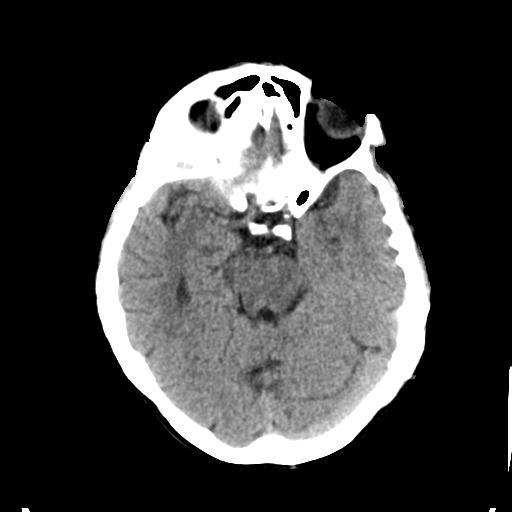
[im 19/37  brain]
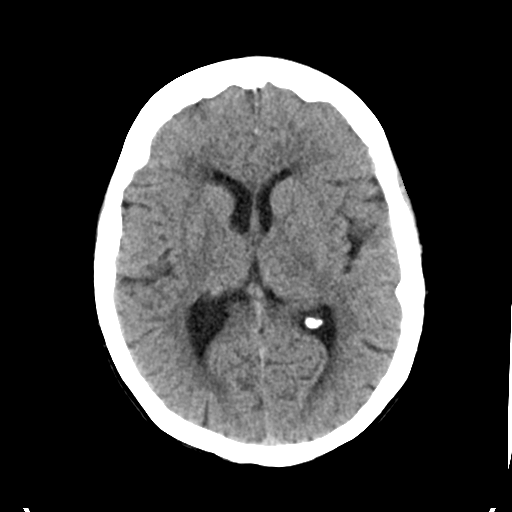
[im 23/37  brain]
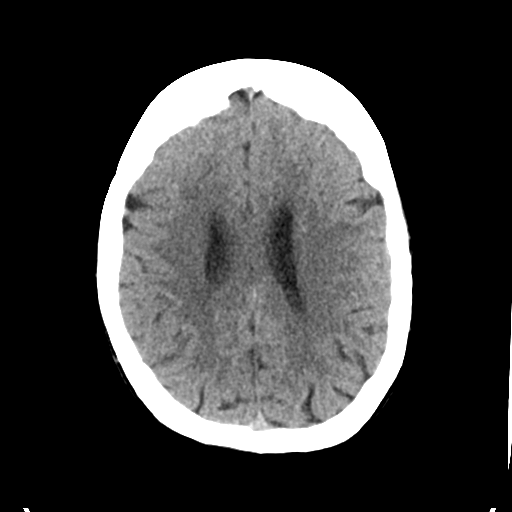
[im 23/37  bone]
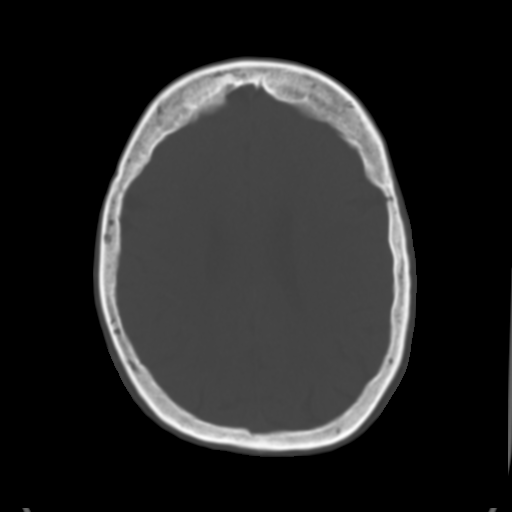
[im 28/37  brain]
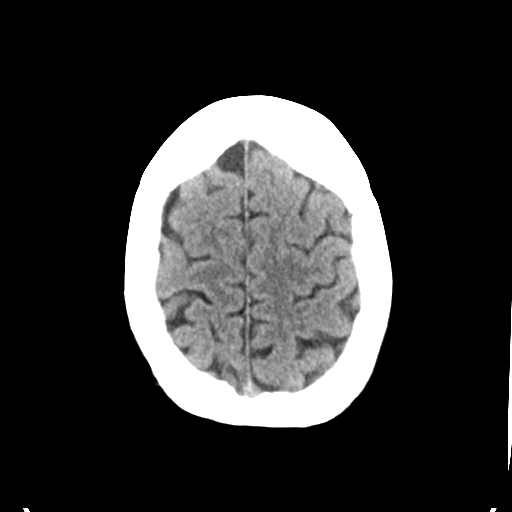
[im 32/37  brain]
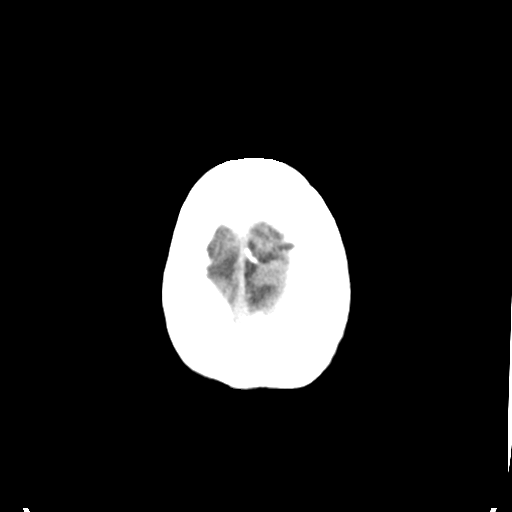

[Series 5: head without cor · coronal · non-contrast · 0.36mm/px · 3 of 67 slices shown]
[im 23/67  brain]
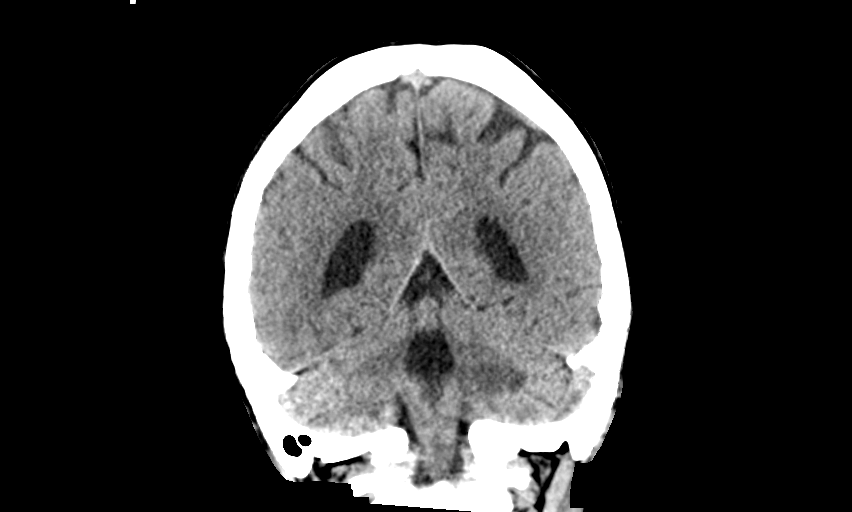
[im 30/67  brain]
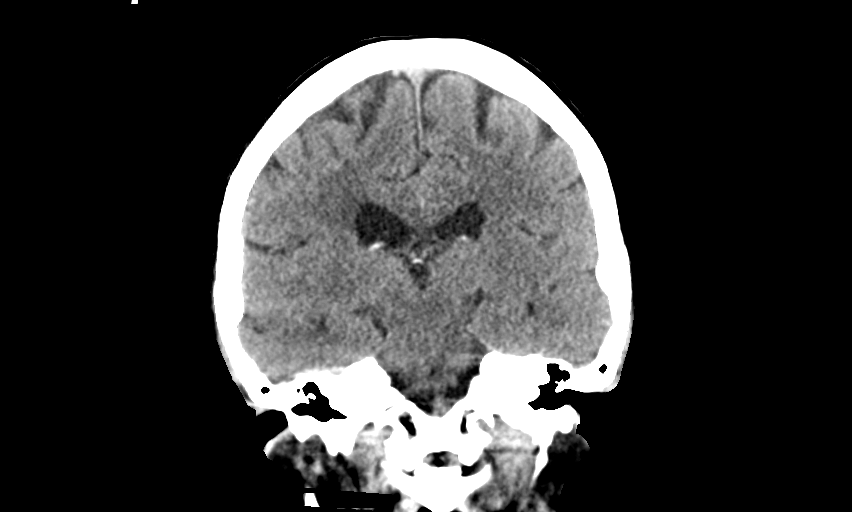
[im 37/67  brain]
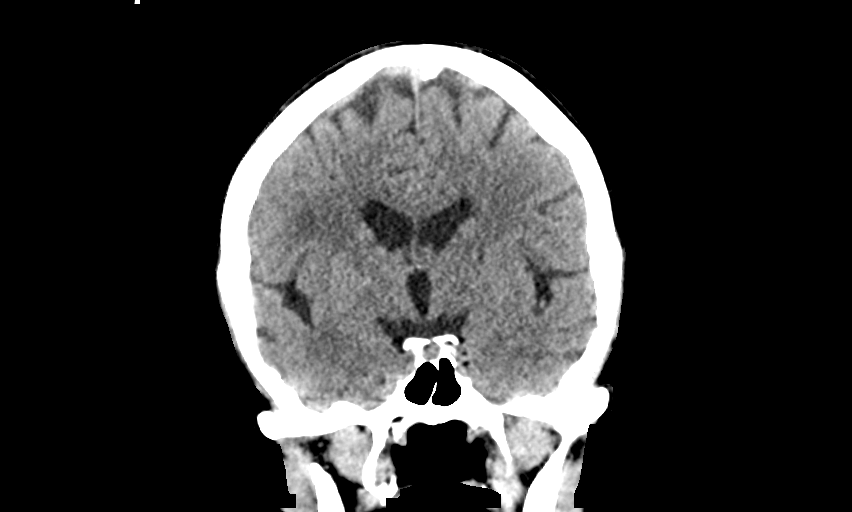

[Series 6: head without sag · sagittal · non-contrast · 0.36mm/px · 3 of 67 slices shown]
[im 25/67  brain]
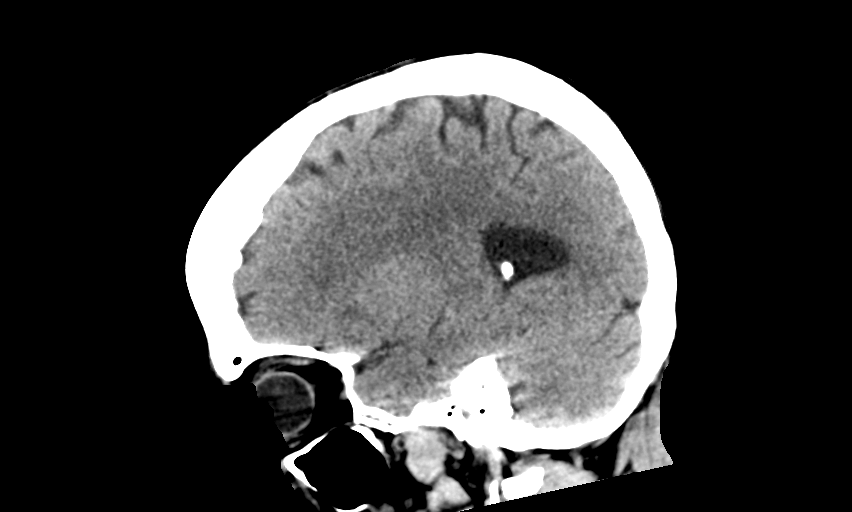
[im 34/67  brain]
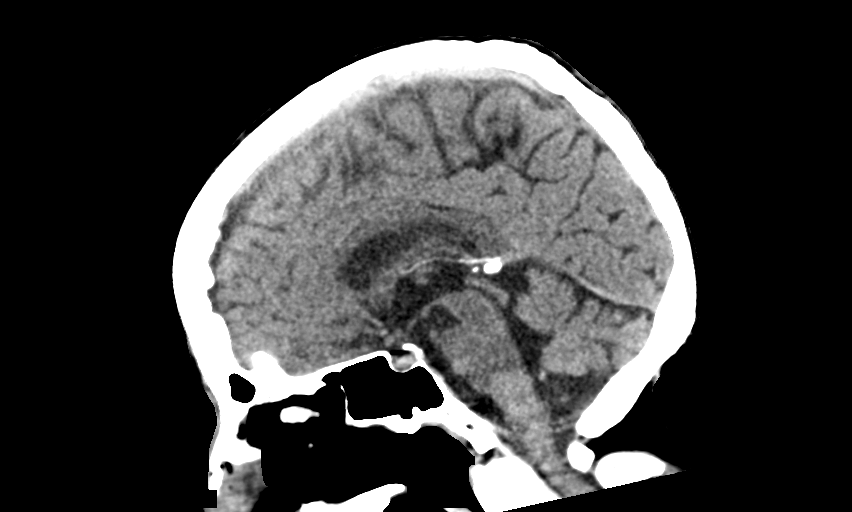
[im 43/67  brain]
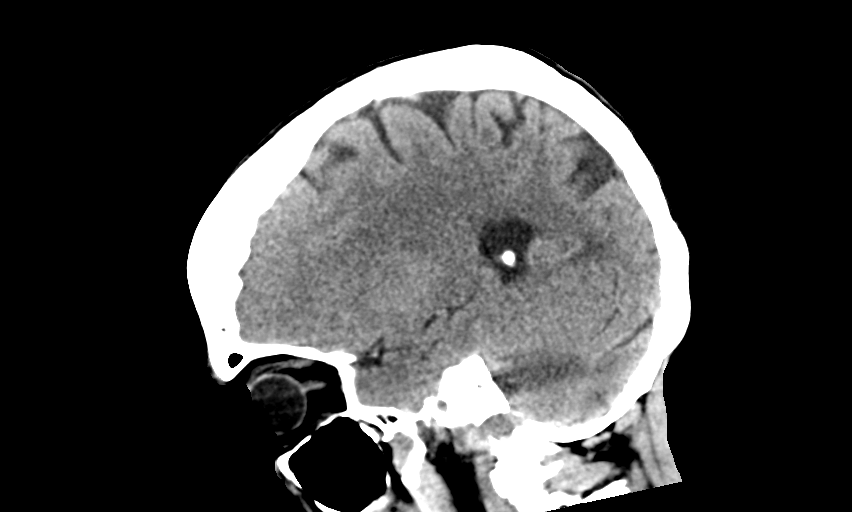

[13 of 47 positions shown; findings below may reference images not displayed]

FINDINGS: Brain: There is mild cerebral atrophy with widening of the
extra-axial spaces and ventricular dilatation.
There are areas of decreased attenuation within the white matter
tracts of the supratentorial brain, consistent with microvascular
disease changes.

Vascular: No hyperdense vessel or unexpected calcification.

Skull: Bilateral nondisplaced nasal bone fractures are noted.

Sinuses/Orbits: No acute finding.

Other: Mild bilateral paranasal soft tissue swelling is seen.
IMPRESSION: 1. Generalized cerebral atrophy.
2. Mild bilateral paranasal soft tissue swelling with bilateral
nondisplaced nasal bone fractures.
3. No acute intracranial abnormality.

## 2022-08-14 IMAGING — CT CT CERVICAL SPINE W/O CM
3 of 4 series · 13 of 33 positions shown, 16 images · non-contrast
Comparison: None.

CLINICAL DATA: Status post fall.

EXAM:
CT CERVICAL SPINE WITHOUT CONTRAST
TECHNIQUE: Multidetector CT imaging of the cervical spine was performed without
intravenous contrast. Multiplanar CT image reconstructions were also
generated.

[Series 4: c_spine 2.0 st · axial · 0.46mm/px · z∈[-294,-182]mm · 5 of 85 slices shown, 7 images]
[im 15/85  soft-tissue]
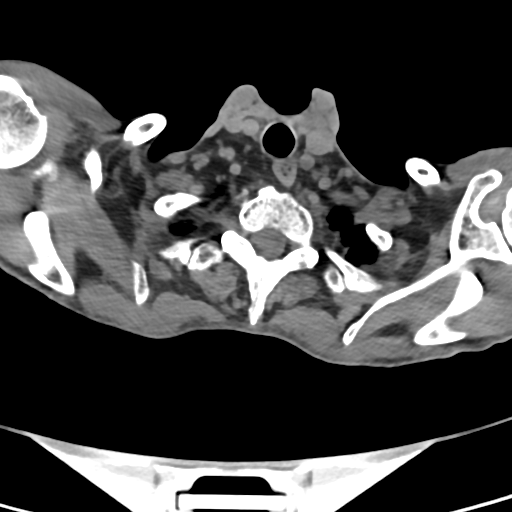
[im 15/85  bone]
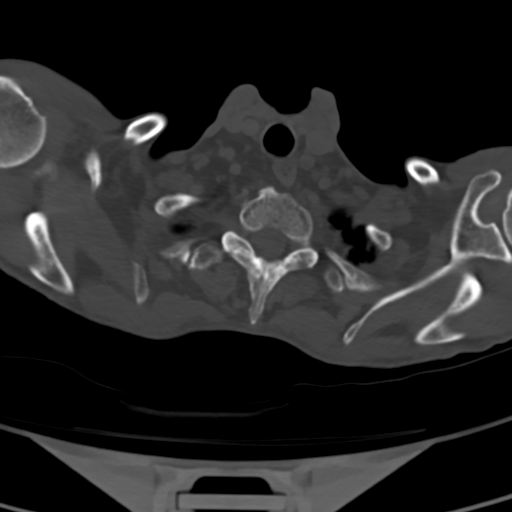
[im 29/85  bone]
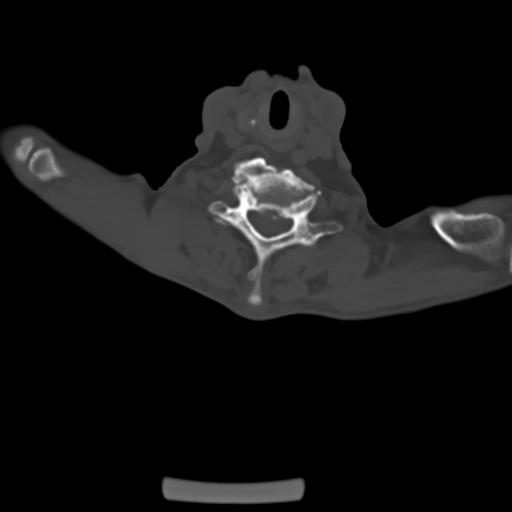
[im 43/85  bone]
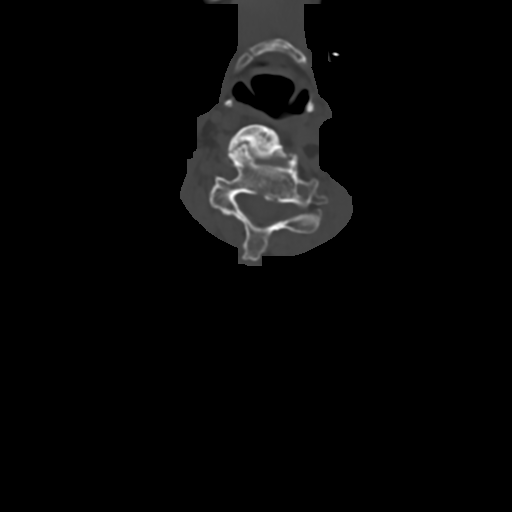
[im 57/85  bone]
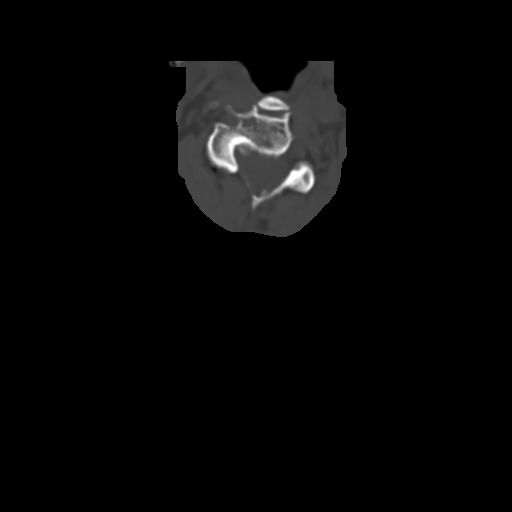
[im 71/85  soft-tissue]
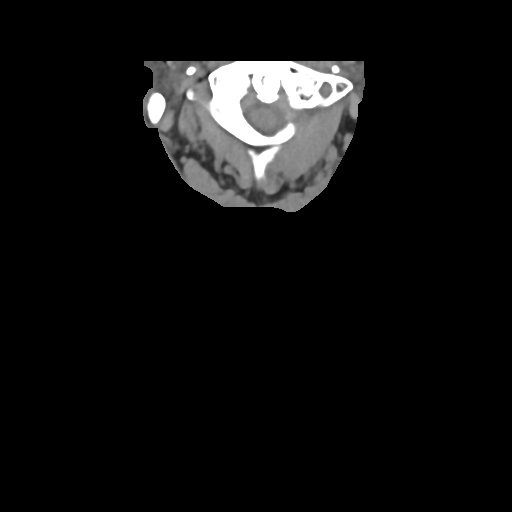
[im 71/85  bone]
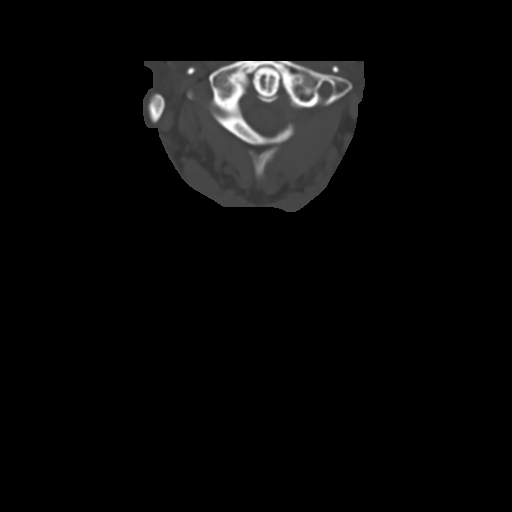

[Series 6: c_spine 2.0 sag bone · sagittal · 0.35mm/px · 5 of 72 slices shown, 6 images]
[im 24/72  bone]
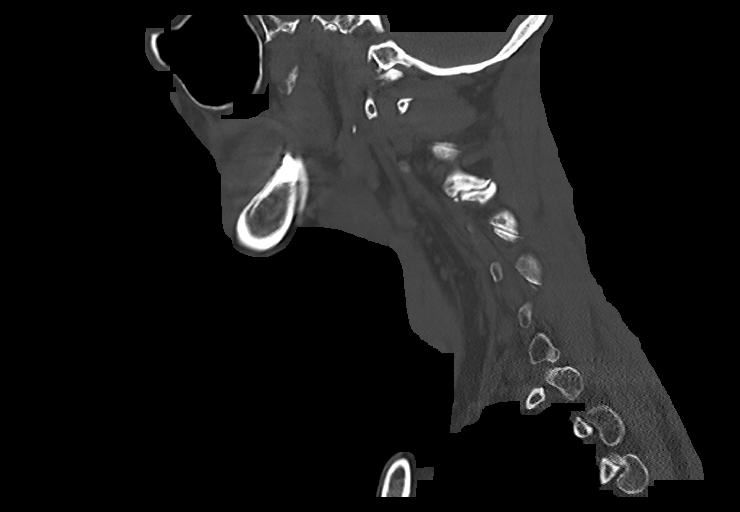
[im 30/72  bone]
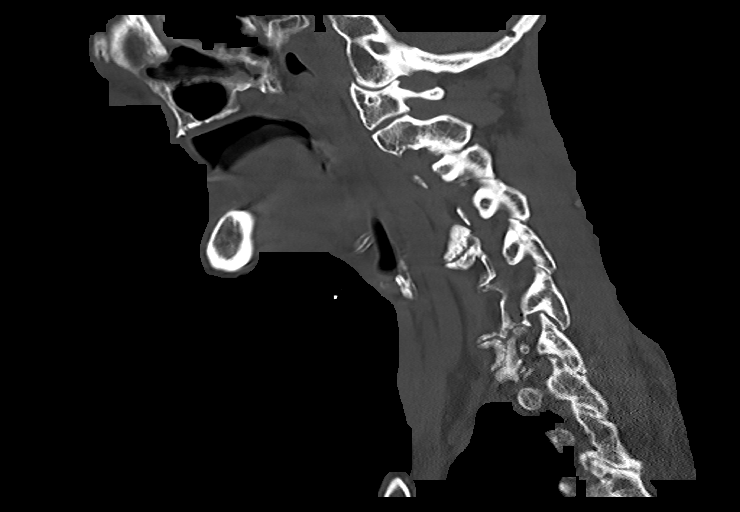
[im 36/72  soft-tissue]
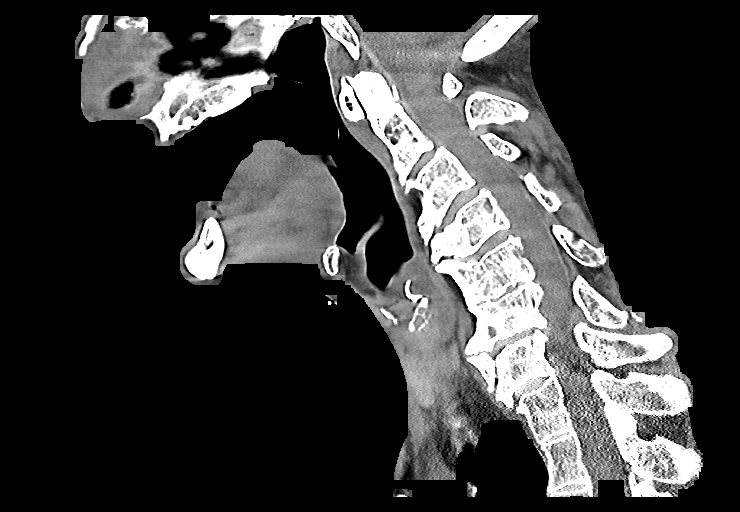
[im 36/72  bone]
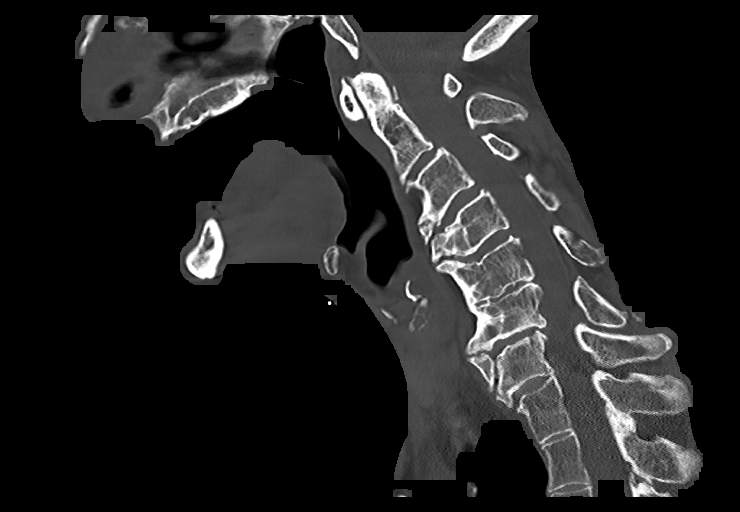
[im 42/72  bone]
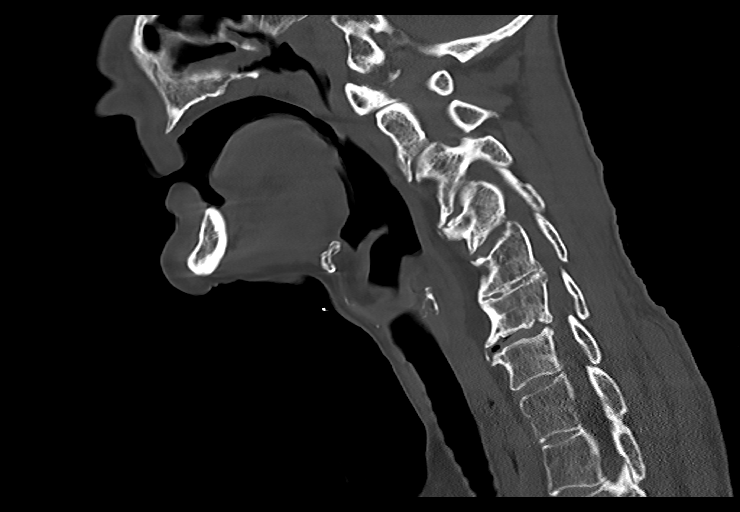
[im 48/72  bone]
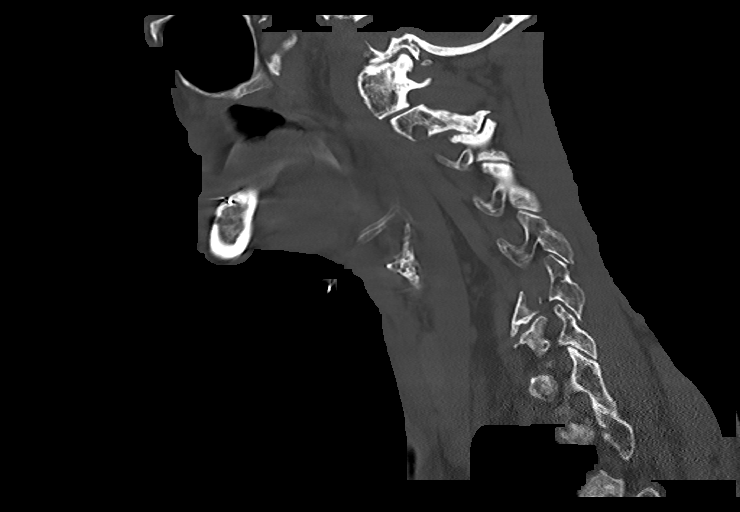

[Series 7: c_spine 2.0 cor bone · coronal · 0.25mm/px · 3 of 93 slices shown]
[im 19/93  bone]
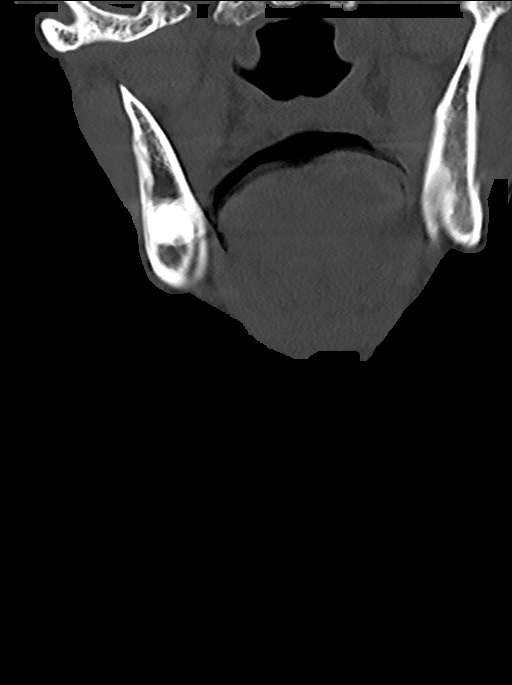
[im 37/93  bone]
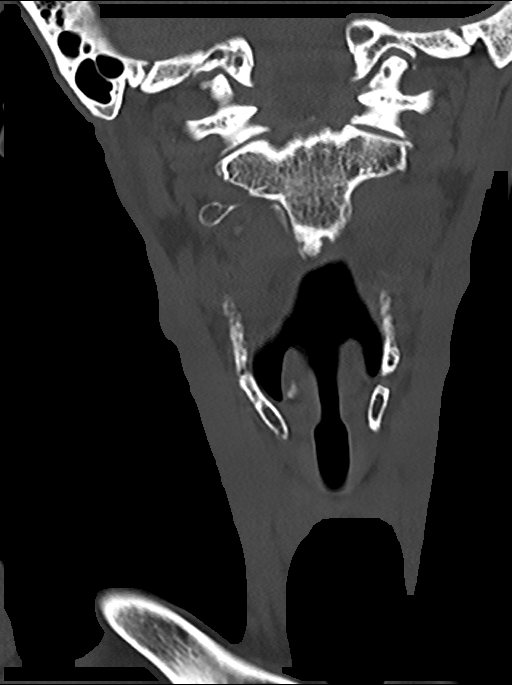
[im 56/93  bone]
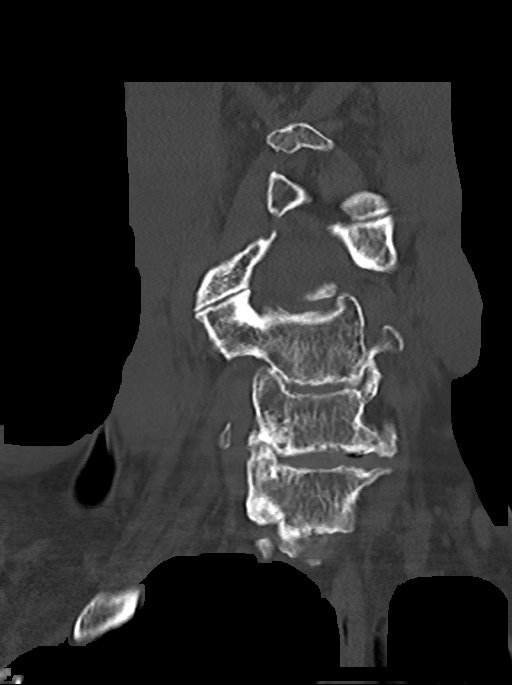

[13 of 33 positions shown; findings below may reference images not displayed]

FINDINGS: Alignment: There is reversal of the normal cervical spine lordosis.

Skull base and vertebrae: No acute fracture. No primary bone lesion
or focal pathologic process.

Soft tissues and spinal canal: No prevertebral fluid or swelling. No
visible canal hematoma.

Disc levels: Moderate severity endplate sclerosis is seen at the
levels of C5-C6 and C6-C7, with marked severity anterior osteophyte
formation seen at the levels of C2-C3, C3-C4, C4-C5, C5-C6 and
C6-C7.

There is moderate to marked severity intervertebral disc space
narrowing at the level of C5-C6 with moderate severity
intervertebral disc space narrowing noted at C6-C7.

Bilateral moderate to marked severity multilevel facet joint
hypertrophy is noted.

Upper chest: Negative.

Other: None.
IMPRESSION: 1. Marked severity multilevel degenerative changes, most prominent
at the levels of C5-C6 and C6-C7.
2. Reversal of the normal cervical spine lordosis, likely
positional.
3. No evidence of an acute cervical spine fracture.

## 2022-08-25 ENCOUNTER — Other Ambulatory Visit: Payer: Self-pay

## 2022-08-25 ENCOUNTER — Emergency Department (HOSPITAL_COMMUNITY)
Admission: EM | Admit: 2022-08-25 | Discharge: 2022-08-26 | Disposition: A | Discharge status: Home / Self Care | Payer: Medicare Other | Attending: Emergency Medicine | Admitting: Emergency Medicine

## 2022-08-25 DIAGNOSIS — E860 Dehydration: Secondary | ICD-10-CM

## 2022-08-25 DIAGNOSIS — Z20822 Contact with and (suspected) exposure to covid-19: Secondary | ICD-10-CM | POA: Insufficient documentation

## 2022-08-25 DIAGNOSIS — Z91040 Latex allergy status: Secondary | ICD-10-CM | POA: Diagnosis not present

## 2022-08-25 DIAGNOSIS — R5381 Other malaise: Secondary | ICD-10-CM | POA: Insufficient documentation

## 2022-08-25 DIAGNOSIS — R1032 Left lower quadrant pain: Secondary | ICD-10-CM | POA: Insufficient documentation

## 2022-08-25 DIAGNOSIS — R531 Weakness: Secondary | ICD-10-CM

## 2022-08-25 NOTE — ED Triage Notes (Signed)
Patient BIB EMS for evaluation of "tingling and numbness" to extremities x 48 hours.  States "it feels like Comet (cleaner) is coming through my hands and feet."  NIH for EMS was 0, VAN negative.  States symptoms worsened tonight.

## 2022-08-25 NOTE — ED Notes (Signed)
Patient report "my stomach hurts.  I think it might be the food I ate.  I have had diarrhea but I think I just need to let the food in my stomach work its way out."

## 2022-08-26 DIAGNOSIS — R5381 Other malaise: Secondary | ICD-10-CM | POA: Diagnosis not present

## 2022-08-26 LAB — COMPREHENSIVE METABOLIC PANEL
ALT: 12 U/L (ref 0–44)
AST: 20 U/L (ref 15–41)
Albumin: 4 g/dL (ref 3.5–5.0)
Alkaline Phosphatase: 58 U/L (ref 38–126)
Anion gap: 11 (ref 5–15)
BUN: 18 mg/dL (ref 8–23)
CO2: 24 mmol/L (ref 22–32)
Calcium: 9 mg/dL (ref 8.9–10.3)
Chloride: 102 mmol/L (ref 98–111)
Creatinine, Ser: 0.56 mg/dL (ref 0.44–1.00)
GFR, Estimated: >60 mL/min (ref >60)
Glucose, Bld: 81 mg/dL (ref 70–99)
Potassium: 3.4 mmol/L — ABNORMAL LOW (ref 3.5–5.1)
Sodium: 137 mmol/L (ref 135–145)
Total Bilirubin: 0.5 mg/dL (ref 0.3–1.2)
Total Protein: 7.2 g/dL (ref 6.5–8.1)

## 2022-08-26 LAB — CBC WITH DIFFERENTIAL/PLATELET
Abs Immature Granulocytes: 0.02 10*3/uL (ref 0.00–0.07)
Basophils Absolute: 0 10*3/uL (ref 0.0–0.1)
Basophils Relative: 1 %
Eosinophils Absolute: 0.1 10*3/uL (ref 0.0–0.5)
Eosinophils Relative: 2 %
HCT: 41.9 % (ref 36.0–46.0)
Hemoglobin: 13.7 g/dL (ref 12.0–15.0)
Immature Granulocytes: 0 %
Lymphocytes Relative: 32 %
Lymphs Abs: 1.5 10*3/uL (ref 0.7–4.0)
MCH: 30 pg (ref 26.0–34.0)
MCHC: 32.7 g/dL (ref 30.0–36.0)
MCV: 91.9 fL (ref 80.0–100.0)
Monocytes Absolute: 0.5 10*3/uL (ref 0.1–1.0)
Monocytes Relative: 10 %
Neutro Abs: 2.6 10*3/uL (ref 1.7–7.7)
Neutrophils Relative %: 55 %
Platelets: 224 10*3/uL (ref 150–400)
RBC: 4.56 MIL/uL (ref 3.87–5.11)
RDW: 13.1 % (ref 11.5–15.5)
WBC: 4.7 10*3/uL (ref 4.0–10.5)
nRBC: 0 % (ref 0.0–0.2)

## 2022-08-26 LAB — URINALYSIS, ROUTINE W REFLEX MICROSCOPIC
Bilirubin Urine: NEGATIVE
Glucose, UA: NEGATIVE mg/dL
Hgb urine dipstick: NEGATIVE
Ketones, ur: 80 mg/dL — AB
Leukocytes,Ua: NEGATIVE
Nitrite: NEGATIVE
Protein, ur: NEGATIVE mg/dL
Specific Gravity, Urine: 1.019 (ref 1.005–1.030)
pH: 5 (ref 5.0–8.0)

## 2022-08-26 LAB — RESP PANEL BY RT-PCR (RSV, FLU A&B, COVID)  RVPGX2
Influenza A by PCR: NEGATIVE
Influenza B by PCR: NEGATIVE
Resp Syncytial Virus by PCR: NEGATIVE
SARS Coronavirus 2 by RT PCR: NEGATIVE

## 2022-08-26 LAB — LIPASE, BLOOD: Lipase: 38 U/L (ref 11–51)

## 2022-08-26 MED ORDER — LACTATED RINGERS IV BOLUS
1000.0000 mL | Freq: Once | INTRAVENOUS | Status: AC
  Administered 2022-08-26: 1000 mL via INTRAVENOUS

## 2022-08-26 MED ORDER — SODIUM CHLORIDE 0.9 % IV BOLUS
1000.0000 mL | Freq: Once | INTRAVENOUS | Status: AC
  Administered 2022-08-26: 1000 mL via INTRAVENOUS

## 2022-08-26 NOTE — ED Provider Notes (Addendum)
[place] EMERGENCY DEPARTMENT AT [facility] Provider Note  CSN: [id number] Arrival date & time: 08/25/22 2334  Chief Complaint(s) Numbness  HPI [name] is a 72 y.o. female with a past medical history listed below who presents to the emergency department with 2 days of generalized malaise.  She reports feeling this way since she left the apartment of one of her friends who had similar symptoms.  She denies any fevers or chills.  No coughing or congestion.  Reports that it feels like her skin is tingly in her upper and lower extremities.  Also endorsing abdominal discomfort with nausea.  No vomiting.  No diarrhea.  No urinary symptoms.  No other physical complaints.  HPI  Past Medical History Past Medical History:  Diagnosis Date   Depression    Schizophrenia [facility])    Patient Active Problem List   Diagnosis Date Noted   Involuntary commitment 03/28/2020   Problem related to psychosocial circumstances 03/28/2020   Bacterial vaginal infection 08/27/2012   SCHIZOPHRENIA, PARANOID, CHRONIC 06/19/2006   WEIGHT LOSS 06/19/2006   Home Medication(s) Prior to Admission medications   Medication Sig Start Date End Date Taking? Authorizing Provider  FLUoxetine (PROZAC) 20 MG tablet Take 1 tablet (20 mg total) by mouth daily. 12/24/20  Yes Dykstra, Quitman Livings, MD  OLANZapine (ZYPREXA) 5 MG tablet Take 5 mg by mouth at bedtime.   Yes [provider]  acetaminophen (TYLENOL) 500 MG tablet Take 1 tablet (500 mg total) by mouth every 6 (six) hours as needed. Patient not taking: Reported on 08/26/2022 12/24/20   Milagros Loll, MD  OLANZapine (ZYPREXA) 7.5 MG tablet Take 1 tablet (7.5 mg total) by mouth at bedtime. Patient not taking: Reported on 08/26/2022 12/24/20   Milagros Loll, MD                                                                                                                                    Allergies Latex and Pravastatin  Review of  Systems Review of Systems As noted in HPI  Physical Exam Vital Signs  I have reviewed the triage vital signs BP 108/82 (BP Location: Left Arm)   Pulse 72   Temp 98.4 F (36.9 C) (Oral)   Resp 14   Wt 42.6 kg   SpO2 100%   BMI 18.36 kg/m   Physical Exam Vitals reviewed.  Constitutional:      General: She is not in acute distress.    Appearance: She is well-developed. She is not diaphoretic.  HENT:     Head: Normocephalic and atraumatic.     Right Ear: External ear normal.     Left Ear: External ear normal.     Nose: Nose normal.  Eyes:     General: No scleral icterus.    Conjunctiva/sclera: Conjunctivae normal.  Neck:     Trachea: Phonation normal.  Cardiovascular:     Rate  and Rhythm: Normal rate and regular rhythm.  Pulmonary:     Effort: Pulmonary effort is normal. No respiratory distress.     Breath sounds: No stridor.  Abdominal:     General: There is no distension.     Tenderness: There is abdominal tenderness in the left lower quadrant.  Musculoskeletal:        General: Normal range of motion.     Cervical back: Normal range of motion.  Neurological:     Mental Status: She is alert and oriented to person, place, and time.  Psychiatric:        Behavior: Behavior normal.     ED Results and Treatments Labs (all labs ordered are listed, but only abnormal results are displayed) Labs Reviewed  RESP PANEL BY RT-PCR (RSV, FLU A&B, COVID)  [id number]  CBC WITH DIFFERENTIAL/PLATELET  COMPREHENSIVE METABOLIC PANEL  LIPASE, BLOOD  URINALYSIS, ROUTINE W REFLEX MICROSCOPIC                                                                                                                         EKG  EKG Interpretation Date/Time:    Ventricular Rate:    PR Interval:    QRS Duration:    QT Interval:    QTC Calculation:   R Axis:      Text Interpretation:         Radiology No results found.  Medications Ordered in ED Medications  sodium chloride 0.9 %  bolus 1,000 mL (has no administration in time range)   Procedures Procedures  (including critical care time) Medical Decision Making / ED Course   Medical Decision Making Amount and/or Complexity of Data Reviewed Labs: ordered. Decision-making details documented in ED Course.    Generalized malaise and abdominal discomfort Also endorsing paresthesias  Differential includes but not limited to viral process (including COVID).  Will assess for intra-abdominal inflammatory/infectious process such as diverticulitis given the left lower quadrant tenderness.  Will also assess for UTI.  Will look for electrolyte/metabolic derangements.  CBC without leukocytosis.  Rest of the labs still pending.  Patient care turned over to oncoming provider. Patient case and results discussed in detail; please see their note for further ED managment.       Final Clinical Impression(s) / ED Diagnoses Final diagnoses:  None    This chart was dictated using voice recognition software.  Despite best efforts to proofread,  errors can occur which can change the documentation meaning.    Nira Conn, MD 08/26/22 2536    Nira Conn, MD 09/05/22 [phone]

## 2022-08-26 NOTE — Discharge Instructions (Signed)
Please be sure to stay well-hydrated, and follow-up with your physician.  Return here for concerning changes in your condition.

## 2022-08-26 NOTE — ED Provider Notes (Signed)
10:37 AM Care of the patient assumed at signout.  On signout the patient is awake, alert, in no distress.  Blood pressure borderline low, though MAP in the 80s.  Results reviewed, discussed, no evidence for urinary tract infection, though with ketone urea, and mild low blood pressure some suspicion for dehydration contributing to her symptoms.  Patient has received 1 L, will receive second liter fluid resuscitation, then appropriate for discharge with outpatient follow-up, repeat evaluation.   Gerhard Munch, MD 08/26/22 1037

## 2022-08-28 ENCOUNTER — Other Ambulatory Visit: Payer: Self-pay | Admitting: Internal Medicine

## 2022-08-29 LAB — BASIC METABOLIC PANEL WITH GFR
BUN: 11 mg/dL (ref 7–25)
CO2: 21 mmol/L (ref 20–32)
Calcium: 9.1 mg/dL (ref 8.6–10.4)
Chloride: 108 mmol/L (ref 98–110)
Creat: 0.55 mg/dL (ref 0.50–1.05)
Glucose, Bld: 134 mg/dL — ABNORMAL HIGH (ref 65–99)
Potassium: 3.7 mmol/L (ref 3.5–5.3)
Sodium: 140 mmol/L (ref 135–146)
eGFR: 99 mL/min/{1.73_m2} (ref >=60)

## 2022-08-29 LAB — EXTRA LAV TOP TUBE

## 2022-08-29 LAB — MAGNESIUM: Magnesium: 1.7 mg/dL (ref 1.5–2.5)

## 2023-02-04 ENCOUNTER — Other Ambulatory Visit: Payer: Self-pay | Admitting: Internal Medicine

## 2023-02-04 DIAGNOSIS — Z1231 Encounter for screening mammogram for malignant neoplasm of breast: Secondary | ICD-10-CM

## 2023-03-10 ENCOUNTER — Ambulatory Visit: Payer: Medicare Other

## 2023-03-11 ENCOUNTER — Ambulatory Visit: Payer: Medicare Other

## 2023-03-11 ENCOUNTER — Ambulatory Visit
Admission: RE | Admit: 2023-03-11 | Discharge: 2023-03-11 | Disposition: A | Discharge status: Home / Self Care | Source: Ambulatory Visit | Attending: Internal Medicine | Admitting: Internal Medicine

## 2023-03-11 DIAGNOSIS — Z1231 Encounter for screening mammogram for malignant neoplasm of breast: Secondary | ICD-10-CM
